# Patient Record
Sex: Male | Born: 1935 | Race: White | Hispanic: No | Marital: Married | Smoking: Former smoker
Health system: Southern US, Community
[De-identification: ages and names within clinical notes are randomized; demographics above are authoritative.]

## PROBLEM LIST (undated history)

## (undated) DIAGNOSIS — T148XXA Other injury of unspecified body region, initial encounter: Secondary | ICD-10-CM

## (undated) DIAGNOSIS — W19XXXA Unspecified fall, initial encounter: Secondary | ICD-10-CM

## (undated) DIAGNOSIS — I493 Ventricular premature depolarization: Secondary | ICD-10-CM

## (undated) DIAGNOSIS — I499 Cardiac arrhythmia, unspecified: Secondary | ICD-10-CM

## (undated) DIAGNOSIS — I2089 Other forms of angina pectoris: Secondary | ICD-10-CM

## (undated) DIAGNOSIS — F0390 Unspecified dementia without behavioral disturbance: Secondary | ICD-10-CM

## (undated) DIAGNOSIS — I208 Other forms of angina pectoris: Secondary | ICD-10-CM

## (undated) DIAGNOSIS — I10 Essential (primary) hypertension: Secondary | ICD-10-CM

## (undated) DIAGNOSIS — I519 Heart disease, unspecified: Secondary | ICD-10-CM

## (undated) DIAGNOSIS — E785 Hyperlipidemia, unspecified: Secondary | ICD-10-CM

## (undated) DIAGNOSIS — I259 Chronic ischemic heart disease, unspecified: Secondary | ICD-10-CM

## (undated) DIAGNOSIS — R6889 Other general symptoms and signs: Secondary | ICD-10-CM

## (undated) HISTORY — PX: HAND SURGERY: SHX662

## (undated) HISTORY — PX: TONSILLECTOMY: SUR1361

## (undated) HISTORY — DX: Other general symptoms and signs: R68.89

## (undated) HISTORY — DX: Cardiac arrhythmia, unspecified: I49.9

## (undated) HISTORY — DX: Unspecified dementia, unspecified severity, without behavioral disturbance, psychotic disturbance, mood disturbance, and anxiety: F03.90

## (undated) HISTORY — DX: Heart disease, unspecified: I51.9

## (undated) HISTORY — DX: Essential (primary) hypertension: I10

## (undated) HISTORY — DX: Ventricular premature depolarization: I49.3

## (undated) HISTORY — DX: Chronic ischemic heart disease, unspecified: I25.9

## (undated) HISTORY — DX: Hyperlipidemia, unspecified: E78.5

## (undated) HISTORY — DX: Other injury of unspecified body region, initial encounter: T14.8XXA

---

## 1998-08-15 ENCOUNTER — Other Ambulatory Visit: Admission: RE | Admit: 1998-08-15 | Discharge: 1998-08-15 | Payer: Self-pay | Admitting: Gastroenterology

## 1998-12-10 ENCOUNTER — Encounter: Payer: Self-pay | Admitting: Orthopedic Surgery

## 1998-12-10 ENCOUNTER — Emergency Department (HOSPITAL_COMMUNITY): Admission: EM | Admit: 1998-12-10 | Discharge: 1998-12-10 | Payer: Self-pay | Admitting: Emergency Medicine

## 1999-08-22 ENCOUNTER — Ambulatory Visit (HOSPITAL_COMMUNITY): Admission: RE | Admit: 1999-08-22 | Discharge: 1999-08-22 | Payer: Self-pay | Admitting: Ophthalmology

## 1999-11-22 ENCOUNTER — Other Ambulatory Visit: Admission: RE | Admit: 1999-11-22 | Discharge: 1999-11-22 | Payer: Self-pay | Admitting: Urology

## 2000-08-15 ENCOUNTER — Encounter: Admission: RE | Admit: 2000-08-15 | Discharge: 2000-08-15 | Payer: Self-pay | Admitting: *Deleted

## 2000-08-15 ENCOUNTER — Encounter: Payer: Self-pay | Admitting: *Deleted

## 2000-10-17 ENCOUNTER — Encounter: Payer: Self-pay | Admitting: Family Medicine

## 2000-10-17 ENCOUNTER — Ambulatory Visit (HOSPITAL_COMMUNITY): Admission: RE | Admit: 2000-10-17 | Discharge: 2000-10-17 | Payer: Self-pay | Admitting: Family Medicine

## 2000-10-27 ENCOUNTER — Ambulatory Visit (HOSPITAL_COMMUNITY): Admission: RE | Admit: 2000-10-27 | Discharge: 2000-10-27 | Payer: Self-pay | Admitting: Family Medicine

## 2000-10-27 ENCOUNTER — Encounter: Payer: Self-pay | Admitting: Family Medicine

## 2001-10-21 ENCOUNTER — Encounter: Payer: Self-pay | Admitting: Family Medicine

## 2001-10-21 ENCOUNTER — Ambulatory Visit (HOSPITAL_COMMUNITY): Admission: RE | Admit: 2001-10-21 | Discharge: 2001-10-21 | Payer: Self-pay | Admitting: Family Medicine

## 2002-03-21 HISTORY — PX: CARDIAC CATHETERIZATION: SHX172

## 2002-03-22 ENCOUNTER — Ambulatory Visit (HOSPITAL_COMMUNITY): Admission: RE | Admit: 2002-03-22 | Discharge: 2002-03-22 | Payer: Self-pay | Admitting: Cardiology

## 2002-11-04 ENCOUNTER — Encounter: Payer: Self-pay | Admitting: Family Medicine

## 2002-11-04 ENCOUNTER — Ambulatory Visit (HOSPITAL_COMMUNITY): Admission: RE | Admit: 2002-11-04 | Discharge: 2002-11-04 | Payer: Self-pay | Admitting: Family Medicine

## 2003-07-02 HISTORY — PX: US ECHOCARDIOGRAPHY: HXRAD669

## 2003-08-30 ENCOUNTER — Inpatient Hospital Stay (HOSPITAL_COMMUNITY): Admission: RE | Admit: 2003-08-30 | Discharge: 2003-09-04 | Payer: Self-pay | Admitting: Specialist

## 2004-12-24 ENCOUNTER — Ambulatory Visit (HOSPITAL_COMMUNITY): Admission: RE | Admit: 2004-12-24 | Discharge: 2004-12-24 | Payer: Self-pay | Admitting: Orthopedic Surgery

## 2008-03-22 ENCOUNTER — Inpatient Hospital Stay (HOSPITAL_COMMUNITY): Admission: RE | Admit: 2008-03-22 | Discharge: 2008-03-24 | Payer: Self-pay | Admitting: Orthopedic Surgery

## 2010-01-12 ENCOUNTER — Encounter: Admission: RE | Admit: 2010-01-12 | Discharge: 2010-01-12 | Payer: Self-pay | Admitting: Family Medicine

## 2010-02-12 ENCOUNTER — Ambulatory Visit: Payer: Self-pay | Admitting: Cardiology

## 2010-02-14 ENCOUNTER — Ambulatory Visit: Payer: Self-pay | Admitting: Cardiology

## 2010-06-13 ENCOUNTER — Ambulatory Visit: Payer: Self-pay | Admitting: Cardiology

## 2010-06-15 ENCOUNTER — Ambulatory Visit: Payer: Self-pay | Admitting: Cardiology

## 2010-10-29 ENCOUNTER — Other Ambulatory Visit: Payer: Self-pay | Admitting: *Deleted

## 2010-10-29 DIAGNOSIS — G479 Sleep disorder, unspecified: Secondary | ICD-10-CM

## 2010-10-29 MED ORDER — LORAZEPAM 0.5 MG PO TABS: 0.5000 mg | ORAL_TABLET | Freq: Every day | ORAL | Status: DC

## 2010-10-29 NOTE — Telephone Encounter (Signed)
Fax to [facility]

## 2010-11-13 NOTE — H&P (Signed)
NAME:  [name], [name]                 ACCOUNT NO.:  [id number]   MEDICAL RECORD NO.:  [id number]          PATIENT TYPE:  INP   LOCATION:  NA                           FACILITY:  [facility]   PHYSICIAN:  Madlyn Frankel. Charlann Boxer, M.D.  DATE OF BIRTH:  September 25, 1932   DATE OF ADMISSION:  DATE OF DISCHARGE:                              HISTORY & PHYSICAL   PROCEDURE:  Left total knee replacement.   CHIEF COMPLAINT:  Left knee pain.   HISTORY OF PRESENT ILLNESS:  An 77 year old male with a history of left  knee pain secondary to osteoarthritis.  It has been refractory to all  conservative treatment.  Does have significant history of a right total  knee replacement in the past with postoperative stiffness.  He has been  presurgically assessed prior to surgery by his cardiologist, Dr.  Patty Sermons.   GASTROENTEROLOGIST:  Dr. Matthias Hughs.   PAST MEDICAL HISTORY:  1. Significant for osteoarthritis.  2. Hypertension.  3. Dyslipidemia.  4. Reflux disease.  5. Impaired hearing.  6. Prostate disease.   PREVIOUS SURGERIES:  Branagan surgery, right knee replacement in 2005 with  postoperative stiffness.   FAMILY MEDICAL HISTORY:  Alzheimer's, brain tumor, heart disease.   SOCIAL HISTORY:  Married.  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   DRUG ALLERGIES:  DARVOCET.   MEDICATIONS:  1. Plavix 75 mg daily.  2. Lipitor 40 mg daily.  3. Niaspan tab daily.  4. Triamterine hydrochlorothiazide 37.5/25 p.o. daily.  5. Lisinopril 20 mg p.o. daily.  6. Toprol XL 50 mg daily.  7. Oxybutynin 5 mg daily.  8. Multivitamin 1 daily.  9. Glucosamine 1500 mg daily.  10.Aspirin 81 mg daily.  11.Celebrex 200 mg 1 p.o. b.i.d. x2 weeks after surgery.   REVIEW OF SYSTEMS:  HEENT, neuro, he has hearing loss with the use of  hearing aid.  Dentures.  Ringing in his ears.  GI:  He has occasional  heartburn, difficulty swallowing.  Otherwise, see HPI.   PHYSICAL EXAMINATION:  VITAL SIGNS:  Pulse 72, respirations 16, blood  pressure 98/56.  Height 5 feet 8 inches, weight 174 pounds.  GENERAL:  Awake, alert, and oriented.  Well-developed, well-nourished in  no acute distress.  NECK:  Supple.  No carotid bruits.  CHEST AND LUNGS:  Clear to auscultation bilaterally.  BREASTS:  Deferred.  HEART:  Regular rate and rhythm with S1, S2 distinct.  ABDOMEN:  Soft, nontender, nondistended.  Bowel sounds present.  GENITOURINARY:  Deferred.  EXTREMITIES:  Left knee 0-130 active range of motion.  SKIN:  No cellulitis.  NEUROLOGIC:  Intact distal sensibilities.   LABORATORY DATA:  Labs, EKG, chest x-ray all pending presurgical  testing.   IMPRESSION:  Left knee osteoarthritis.   PLAN:  Prednisone 12-day Dosepak postoperatively to minimize swelling  and decrease scarring to maximize postoperative range of motion  potential.  Risks and complications were discussed with the patient  including postoperative stiffness.   POSTOPERATIVE MEDICATIONS:  Include Lovenox, Robaxin, iron, aspirin,  Colace, Miralax, provided at the time of history and physical.  Celebrex  will be utilized preoperatively  as well.     ______________________________  Yetta Glassman. [address]ta [address], [place]      Madlyn Frankel. Charlann Boxer, M.D.  Electronically Signed    BLM/MEDQ  D:  03/14/2008  T:  03/15/2008  Job:  [id number]   cc:   Cassell Clement, M.D.  Fax: [phone]   Bernette Redbird, M.D.  Fax: [phone]

## 2010-11-13 NOTE — Op Note (Signed)
NAME:  [name], [name]                 ACCOUNT NO.:  [id number]   MEDICAL RECORD NO.:  [id number]          PATIENT TYPE:  INP   LOCATION:  0007                         FACILITY:  [facility]   PHYSICIAN:  Madlyn Frankel. Charlann Boxer, M.D.  DATE OF BIRTH:  Sep 11, 1932   DATE OF PROCEDURE:  03/22/2008  DATE OF DISCHARGE:                               OPERATIVE REPORT   PREOPERATIVE DIAGNOSIS:  Left knee osteoarthritis, history of right  total knee replacement, postoperative stiffness, arthrofibrosis.   POSTOPERATIVE DIAGNOSIS:  Left knee osteoarthritis, history of right  total knee replacement, postoperative stiffness, arthrofibrosis.   PROCEDURE:  Left total knee replacement.   COMPONENTS USED:  DePuy rotating platform posterior stabilized knee  system size 4 narrow femur, 3 tibia, 12.5 insert to match the 4 femur  and a 38 patellar button.   SURGEON:  Madlyn Frankel. Charlann Boxer, M.D.   ASSISTANT:  Yetta Glassman. Mann, PA   ANESTHESIA:  Duramorph spinal including use of some Decadron.   DRAINS:  Times one.   TOURNIQUET TIME:  40 minutes at 250 mmHg.   COMPLICATIONS:  None.   INDICATIONS FOR PROCEDURE:  [name] is a 77 year old gentleman who I  have been following for advancing left knee arthritis failing  conservative measures.  He had a lot of concerns based on proceeding  with left total knee replacement based on how the patient was left with  his right knee.  He had a right total knee replacement and had  arthrofibrosis and manipulation with failure to respond to any  significant range of motion.  His range of motion on his right side is  basically 5 to about 70-80 degrees.   We discussed these risks and benefits and ways to try to prevent it  which included the use of Decadron perioperative and prednisone postop  to try to decrease inflammatory response to scarring and healing; think  effect the standard risks of infection, DVT, component failure, need for  revision surgery.  Consent was obtained.   PROCEDURE IN DETAIL:  The patient was brought to the operative theater.  Once adequate anesthesia, preoperative antibiotics, Ancef administered,  the patient was positioned supine with a thigh tourniquet placed.  The  left lower extremity was pre scrubbed and prepped and draped in sterile  fashion.  Leg was exsanguinated, tourniquet elevated to 250 mmHg.  A  midline incision was made followed by a median arthrotomy.  Following  initial debridement attention was directed to the patella.  Precut  measurement was 21, 22 mm, I resected down to 13 mm and used a 38  patellar button.  Lug holes were drilled and a metal shim placed to  protect the patella.   The knee was then flexed and attention directed to the femur.  Femoral  canal was opened with a drill and irrigated to try to prevent fat  emboli.  Intramedullary rod was passed, with 3 degrees of valgus I  resected 10 mm bone off the distal femur.   Attention was now directed to the tibia.  The tibia was subluxated  anteriorly with removal of meniscus  and cruciate.  I used an  extramedullary guide and resected the proximal tibia in perpendicular  plane in both planes.   At this point I checked my extension gap to make sure that I was happy  with at least a 10 mm insert and with the 10 it seemed to hyperextend a  little bit and this was noted.   We went back to the femur.  At this point our attention was redirected  back to the femur where I measured it to be a size 4 in anterior and  posterior dimension.  I used the intramedullary rod at this point and  set the rotation of the femoral cutting block based on the cut surface  of the tibia which was perpendicular checking alignment rod.  Once this  block was pinned into position it was exchanged for the cutting block.  The anterior posterior and chamfer cuts were then made without notching  the femur.  Final box cut was made.  Based on the medial and lateral  dimensions of the knee I  decided I would use a 4 narrow component to  prevent any overhanging.   Following the box cut the tibia was subluxated anteriorly and attention  directed to the tibia.  Based on the position and orientation of the  tibia I decided to use a 5 degree lateralizing it and removing medial  osteophytes.  I turned it in position, drilled it and keel punched.   The trial reduction was now carried out with a 4 narrow, 3 tibial insert  and a size 3 tibial tray and a 12.5 insert.  The knee came to full  extension and flexed nicely with the patella tracking through the  trochlea without any application of pressure.   At this point the trial components were removed.  We injected the  synovial capsule junction layer with 60 mL of quarter percent Marcaine  with epinephrine and 1 mL of Toradol.  The knee was irrigated with  normal saline solution pulse lavage.  I debrided further soft tissues as  necessary.  Final components were opened as cement mixed.  Final  components were then cemented into position.  The knee was brought to  full extension with a 12.5 insert.  Extruded cement was removed.  Once  cement cured I removed remaining cement off the posterior aspect of the  knee, reirrigated prior to placing this final size 12.5 insert to match  the 4 narrow femur.  The knee was reduced with stable ligaments from  extension to flexion.   We reirrigated the knee at this point with pulse lavage and placed a  medium Hemovac drain deep, reapproximated extensor mechanism with #1  Vicryl with the knee in flexion.  The remainder of the wound was closed  with 2-0 Vicryl and running 4-0 Monocryl.  The knee was cleaned, dried  and dressed sterilely with Steri-Strips and bulky sterile wrap.  He was  brought to the recovery room in stable condition tolerating the  procedure well and was given CPM immediately.      Madlyn Frankel Charlann Boxer, M.D.  Electronically Signed     MDO/MEDQ  D:  03/22/2008  T:  03/23/2008   Job:  [id number]

## 2010-11-16 NOTE — H&P (Signed)
NAME:  [name], [name]                           ACCOUNT NO.:  [id number]   MEDICAL RECORD NO.:  [id number]                   PATIENT TYPE:  INP   LOCATION:  NA                                   FACILITY:  [facility]   PHYSICIAN:  Erasmo Leventhal, M.D.         DATE OF BIRTH:  [birth year]   DATE OF ADMISSION:  08/30/2003  DATE OF DISCHARGE:                                HISTORY & PHYSICAL   CHIEF COMPLAINT:  Right knee osteoarthritis.   HISTORY OF PRESENT ILLNESS:  This is a 77 year old gentleman well-known to  [place] with a history of bilateral osteoarthritis of the knees, right greater  than left.  The patient has had conservative therapy consisting of  arthroscopy, injections and therapy, without relief of his symptoms.  After  discussion of risks and benefits, the patient is now scheduled for total  knee arthroplasty.  Surgery risks, benefits, and aftercare were discussed in  detail with the patient.  Questions were invited and answered, and surgery  will go ahead as scheduled.  He has had medical clearance by his medical  doctor, Dr. Merri Brunette, and his cardiologist, Dr. Patty Sermons.   PAST MEDICAL HISTORY:   DRUG ALLERGIES:  DARVOCET.   CURRENT MEDICATIONS:  1. Toprol 50 mg one q.a.m.  2. Lipitor 40 mg one q.h.s.  3. Niaspan 1500 mg q.h.s.  4. Plavix 75 mg one q.d., but he stopped these prior to surgery.  5. Triamterene/hydrochlorothiazide 37.5/25 mg one half tablet q.d.  6. Monopril 40 mg q.a.m.  7. Aspirin, which he has stopped.   PREVIOUS SURGERIES:  Include Brew surgery, ear surgery, and bilateral knee  arthroscopies.   SERIOUS MEDICAL ILLNESSES:  Include coronary artery disease, hypertension,  history of TIA, osteoarthritis, and hypercholesterolemia.   SOCIAL HISTORY:  The patient is married.  He is retired.  He does not smoke  or drink.   FAMILY HISTORY:  Positive for Alzheimer's and cancer.   REVIEW OF SYSTEMS:  CNS:  Positive for history of TIAs.  No  headaches,  blurry vision or dizziness.  CARDIOVASCULAR:  Positive for hypertension,  coronary artery disease.  PULMONARY:  Positive for exertional shortness of  breath.  Negative for PND or orthopnea.  GI:  Negative for ulcers,  hepatitis.  GU:  Negative for urinary tract difficulty.  MUSCULOSKELETAL:  Positive in HPI.   PHYSICAL EXAM:  VITAL SIGNS:  BP 130/80, respirations 16, pulse 56 and  regular.  GENERAL APPEARANCE:  This is a well-developed, well-nourished gentleman in  no acute distress.  HEENT:  Head normocephalic.  Nose patent.  Ears patent.  Pupils equal, round  and reactive to light.  Throat without injection.  NECK:  Supple without adenopathy.  Carotids 2+ without bruit.  CHEST:  Clear to auscultation.  No rales or rhonchi.  Respirations 16.  HEART:  Regular rate and rhythm at 56 beats per minute without murmur.  ABDOMEN:  Soft without active  bowel sounds.  No masses or organomegaly.  NEUROLOGIC:  Patient alert and oriented to time, place and person.  Cranial  nerves II through XII grossly intact.  EXTREMITIES:  Within normal limits with the exception of the bilateral  knees, which show varus deformity bilaterally.  He has full extension with  further flexion to 135 degrees.  Dorsalis pedis and posterior tibial pulses  are 2+.   X-RAYS:  End-stage osteoarthritis with bone-on-bone arthritis of the right  knee.   IMPRESSION:  Osteoarthritis, end-stage, right knee.   PLAN:  Total knee arthroplasty, right knee.      Jaquelyn Bitter. Chabon, P.A.                   Erasmo Leventhal, M.D.    SJC/MEDQ  D:  08/26/2003  T:  08/26/2003  Job:  [id number]

## 2010-11-16 NOTE — Discharge Summary (Signed)
NAME:  [name], [name]                 ACCOUNT NO.:  [id number]   MEDICAL RECORD NO.:  [id number]          PATIENT TYPE:  INP   LOCATION:  1610                         FACILITY:  [facility]   PHYSICIAN:  Madlyn Frankel. Charlann Boxer, M.D.  DATE OF BIRTH:  1933/05/22   DATE OF ADMISSION:  03/22/2008  DATE OF DISCHARGE:  03/24/2008                               DISCHARGE SUMMARY   ADMITTING DIAGNOSES:  1. Osteoarthritis.  2. Hypertension.  3. Dyslipidemia.  4. Reflux disease.  5. Proteinuria.  6. Prostate disease.   DISCHARGE DIAGNOSES:  1. Osteoarthritis.  2. Hypertension.  3. Dyslipidemia.  4. Reflux disease.  5. Proteinuria.  6. Prostate disease.   HISTORY OF PRESENT ILLNESS:  A 77 year old male with a history of left  knee pain secondary to osteoarthritis.  Refractory to all conservative  treatment.  History of right total knee replacement in the past with  postoperative stiffness.   CONSULTS:  None.   PROCEDURE:  Left total knee replacement.   SURGEON:  Dr. Durene Romans, assistant Dwyane Luo, PA.   LABORATORY DATA:  His CBC upon admission showed his hematocrit to be  39.9, platelets 155.  At time of discharge hematocrit 27.8 and platelets  92.  White cell differential normal.  Coagulation all within normal  limits.  Routine chemistry upon admission all within normal limits and  tracked throughout.  At time of discharge sodium 140, potassium 3.9,  glucose 118, and creatinine 0.79.  Kidney function remain normal with  GFR greater than 60.  Last calcium was 8.6.  His UA was negative.   Cardiology:  EKG shows sinus bradycardia, otherwise normal ECG.   Radiology:  Chest 2-view showed no interval changes, no acute processes.   HOSPITAL COURSE:  The patient admitted to the hospital underwent a left  total knee replacement and admitted to orthopedic floor.  He had a  little bit of hypotension, otherwise no significant problems during his  course of stay.  He remained hemodynamically stable.   Hemovac was  discontinued on day 1 and dressing remained dry.  Wound without  significant drainage.  Made excellent progress with his physical  therapy.  We started him on prednisone, decreased postoperative  stiffness as well as scaring.  We held his blood pressure medicines.  He  ambulated 300 feet, modified independent.  Left knee wound was dry.  We  sent him home with a CPM machine.   DISCHARGE DISPOSITION:  Discharge home.  Stable, improved condition with  home health care physical therapy.   DISCHARGE PHYSICAL THERAPY:  Weightbearing as tolerated with the use of  a rolling walker.  CPM 0-80 degrees increased 10-20 degrees per day for  48 hours daily as tolerated.   DISCHARGE DIET:  Regular.   DISCHARGE WOUND CARE:  Keep dry.   DISCHARGE MEDICATIONS:  1. Lovenox 40 mg subcutaneously every 24 times 11 days.  2. Robaxin 500 mg p.o. every 6.  3. Iron 325 p.o. t.i.d. x2 weeks.  4. Aspirin 325 p.o. every day.  5. Colace 100 mg p.o. b.i.d.  6. MiraLax 17 g p.o. every  day.  7. Vicodin 7.5/325 one to two p.o. every 4-6 p.r.n. pain.  8. Prednisone 5 mg 12 day Dosepak take as directed.  9. Triamterene and hydrochlorothiazide 37.5/25 one p.o. every day.  10.Metoprolol ER 50 mg p.o. every day.  11.Oxybutynin 5 mg p.o. every day.  12.Niaspan ER 750 mg p.o. every day.  13.Lisinopril 40 mg p.o. daily.  Hold while systolic pressure less      than or equal to 120.  14.Lipitor 40 mg p.o. every day.  15.Plavix 75 mg p.o. every day.  16.Multivitamin every day.  17.Celebrex 200 mg p.o. b.i.d.   DISCHARGE FOLLOWUP:  Follow up with Dr. Charlann Boxer at phone number [phone] in  2 weeks.     ______________________________  Yetta Glassman [address], [place]      Madlyn Frankel. Charlann Boxer, M.D.  Electronically Signed   BLM/MEDQ  D:  04/05/2008  T:  04/05/2008  Job:  [id number]   cc:   Bernette Redbird, M.D.  Fax: [phone]

## 2010-11-16 NOTE — Cardiovascular Report (Signed)
   NAME:  [name], [name]                           ACCOUNT NO.:  [id number]   MEDICAL RECORD NO.:  [id number]                   PATIENT TYPE:  OIB   LOCATION:  2899                                 FACILITY:  [facility]   PHYSICIAN:  Peter M. [place], M.D.               DATE OF BIRTH:  October 12, 1932   DATE OF PROCEDURE:  03/21/2002  DATE OF DISCHARGE:                              CARDIAC CATHETERIZATION   INDICATIONS FOR PROCEDURE:  The patient is a 77 year old white male with a  history of hypertension, hypercholesterolemia, who has had a recent abnormal  stress Cardiolite study. He has known mild left ventricular dysfunction  by  previous Cardiolite study and by echocardiogram.   ACCESS:  Via the right femoral artery using the standard Seldinger  technique.   EQUIPMENT:  The 6 French 4 cm right and left Judkins catheter, 6 French  pigtail catheter, 6 French arterial sheath.   MEDICATIONS:  Local anesthesia with 1% Xylocaine, Versed 1 mg IV.   CONTRAST:  Omnipaque 100 cc.   HEMODYNAMIC DATA:  Aortic pressure was 109/61 with a mean of 82.  Left  ventricular  pressure 106 with an EDP of 10 mmHg.   ANGIOGRAPHIC DATA:  Left coronary artery arises and  distributes normally.   The left main coronary artery is normal.   The left anterior descending artery has 10% narrowing proximally.  No other  significant disease noted.   There is a large intermediate branch which has a 10-20% narrowing  proximally.   The left circumflex coronary artery is normal.   The right coronary artery is normal.   LEFT VENTRICULAR ANGIOGRAPHY:  The left ventricular angiography performed in  the RAO view demonstrates normal left ventricular size with mild global  hypokinesia.  Overall, ejection fraction is estimated at 50%.  There is no  mitral regurgitation or prolapse.    FINAL INTERPRETATION:  1. Minimal nonobstructive atherosclerotic coronary artery disease.  2. Mildly impaired left ventricular  function.                                                   Peter M. [place], M.D.    PMJ/MEDQ  D:  03/22/2002  T:  03/23/2002  Job:  [id number]   cc:   Thomas A. Patty Sermons, M.D.

## 2010-11-16 NOTE — Op Note (Signed)
NAME:  [name], [name]                           ACCOUNT NO.:  [id number]   MEDICAL RECORD NO.:  [id number]                   PATIENT TYPE:  INP   LOCATION:  2899                                 FACILITY:  [facility]   PHYSICIAN:  Erasmo Leventhal, M.D.         DATE OF BIRTH:  [birth year]   DATE OF PROCEDURE:  08/30/2003  DATE OF DISCHARGE:                                 OPERATIVE REPORT   PREOPERATIVE DIAGNOSIS:  Right knee end stage osteoarthritis.   POSTOPERATIVE DIAGNOSIS:  Right knee end stage osteoarthritis.   OPERATION PERFORMED:  Right total knee arthroplasty.   SURGEON:  Erasmo Leventhal, M.D.   ASSISTANT:  Jaquelyn Bitter. Chabon, P.A.   ANESTHESIA:  Attempted epidural followed by general with femoral nerve  block.   ESTIMATED BLOOD LOSS:  Less than 50 mL.   DRAINS:  Two medium Hemovacs.   COMPLICATIONS:  None.   TOURNIQUET TIME:  One hour 25 minutes at 350 mmHg.   IMPLANTS:  Osteonics components all cemented size 9 femur, size 9 tibia, 15  mm posterior stabilized Flex tibial insert and a 26 mm patella.   DISPOSITION:  PACU stable.   DESCRIPTION OF PROCEDURE:  The patient was counseled in the holding area.  IV was started.  Antibiotics given.  Epidural catheter was attempted by  anesthesia.  This was unsuccessful.  The patient was taken to the operating  room and placed under general anesthesia.  Foley catheter was placed  utilizing sterile technique by the operating room circulating nurse.  The  right knee was examined. He had a 7 degree flexion contracture, could flex  to 125 degrees.  The right leg was prepped with DuraPrep and draped in  sterile fashion.  Exsanguinated with Esmarch.  Tourniquet was inflated to  350 mmHg.  Straight and midline incision made through skin and subcutaneous  tissue.  Medial and lateral soft tissue flaps were developed at appropriate  level.  Medial parapatellar arthrotomy was performed.  Medial soft tissue  was released on  proximal medial tibia.  Patella was everted.  End stage  arthritic changes. Bone-against-bone contact.  Cruciate ligaments were  resected.  Starter hold made distal femur canal. Effluent was clear.  Intramedullary rod was gently placed.  Chose to take a 5 degree valgus cut  with a 12 mm cut off the distal femur.  Distal femur was cut.  At this time  the distal femur was found to be size #9, rotation marks were made and it  was cut to fit a size 9.  Medial and lateral menisci were removed.  Geniculate vessels were coagulated under direct visualization.  Posterior  neurovascular structures were thought about and protected throughout the  entire case.  Osteophytes were removed from the tibia.  Tibial eminence was  removed.  Proximal tibia was found to be a size 9.  Starter hole was made.  Step reamer was utilized.  Intramedullary rod  was gently placed after  irrigating the canal until the effluent was clear.  Chose a 0 degree slope  with a 10 mm cut based on the lateral side.  Proximal tibia was cut  appropriately.  Posteromedial, posterolateral femoral osteophytes were  removed meticulously under direct visualization.  The femoral cuts were  prepared.   With the size 9 femur, size 9 tibia, with a __________ insert  with  excellent range of motion, soft tissue balance in flexion and extension,  excellent alignment, rotation marks were made.  Delta keel was performed in  standard fashion.  Patella was found to be a size 26, reamed to the  appropriate depth.  Locking holes were made.  At this time the knee was  irrigated thoroughly with pulsatile lavage.  The cement was mixed.  Utilizing modern cement technique, all components were cemented in place,  size 9 tibia, size 9 femur, 26 patella.  After cement had cured, we did a  trial to 12 and 15 mm thickness with a 15 mm flexed tibial insert with an  excellent range of motion in flexion and extension.  Well-balanced in  flexion and extension.   ____________ tract was anatomic and did not require  lateral release.  The 50 mm trial was removed.  Bone wax was placed on  exposed bony surfaces.  Hemostasis was obtained posteromedial and  posterolateral.  A final 14 mm tibial insert was tapped into position,  flexed,  posteriorly stabilized.  Two medium Hemovac drains were placed,  knee joint and soft tissues irrigated with antibiotic solution and closed.  Arthrotomy closed with Vicryl sutures interrupted figure-of-eight fashion.  Subcu Vicryl.  Skin closed with subcuticular Monocryl suture.  Sterile  compressive dressing applied. Xeroform gauze over placement of a drain.  Tourniquet deflated.  Normal circulation to foot and ankle end of case.  Given another gram Ancef intravenously.  Sponge and needle counts were  correct.  Ice packs were applied.  Knee immobilized full extension.  Patient  awakened.  Femoral nerve block was administered for anesthesia.  Patient was  then awakened.  He was taken from operating room to recovery in stable  condition.  Sponge and needle counts were correct.  There were no  complications.  The patient tolerated the procedure well with no  complications.   To decrease surgical time, help throughout the entire surgical procedure by  Mr. Jeannett Senior Chabon's assistance was needed.                                               Erasmo Leventhal, M.D.    RAC/MEDQ  D:  08/30/2003  T:  08/31/2003  Job:  [id number]

## 2010-11-16 NOTE — Discharge Summary (Signed)
NAME:  Shaikh, DEVON PRETTY                           ACCOUNT NO.:  [id number]   MEDICAL RECORD NO.:  [id number]                   PATIENT TYPE:  INP   LOCATION:  5008                                 FACILITY:  [facility]   PHYSICIAN:  Erasmo Leventhal, M.D.         DATE OF BIRTH:  December 14, 1932   DATE OF ADMISSION:  08/30/2003  DATE OF DISCHARGE:  09/04/2003                                 DISCHARGE SUMMARY   ADMITTING DIAGNOSIS:  Right knee osteoarthritis.   DISCHARGE DIAGNOSIS:  Right knee osteoarthritis.   OPERATION:  Total knee arthroplasty, right knee.   BRIEF HISTORY:  This is a 77 year old gentleman with a history of  osteoarthritis to the bilateral knees, right greater than left.  He has  failed conservative treatment.  He is now scheduled for total knee  arthroplasty.  The surgery's benefits and risks were discussed with the  patient.  Surgery was scheduled.   LABORATORY VALUES:  Admission CBC within normal limits with the exception of  the platelets low at 149.  His hemoglobin hematocrit reached a low of  __________  on the 5th and then started back up.  Platelets reached a low of  86 but by discharge were up to 149.  Admission C-MET showed the CO2 slightly  high at 34, otherwise normal.  His glucoses ran elevated through the  admission with a high of 233 on the 1st, down to 130 by discharge.  He also  was a little hyponatremic on the 4th and 5th and a little hypokalemic as  well.  These were corrected.  Lipid profile showed cholesterol 178 with  triglycerides at 180 with HDL low at 38 and LDL high at 104.  Urinalysis was  within normal limits on the 23rd.  On the 3rd, showed moderate leukocyte  esterase.  Blood cultures were negative.   Portable chest x-ray showed mild basilar atelectasis.   HOSPITAL COURSE:  The patient tolerated the operative procedure well.  [facility]  hospitalists were contacted to manage the patient's medical course  postoperatively for his hypertension,  history of multiple TIAs,  hyperlipidemia.  The first postoperative day, the patient was doing fine.  Vital signs were stable.  Temp to 100.5.  Lunges were clear.  Calves were  negative.  Drain was removed without difficulty.  He was noted to have  elevated glucose and CBGs were ordered a.c. and h.s.  The second  postoperative day, vital signs remained stable with a temperature of 102.7,  O2 sat 91 on room air, CBG 143, platelets had dropped to 89,000.  PT INR  showed INR at 1.8.  Calves were negative.  Wound was benign.  Lungs were  clear.  Medical service was asked to followup fever of unknown origin and  decreased platelets.  Portable chest x-ray was ordered as were blood  cultures.  Third post day, the patient was feeling good.  Vital signs were  stable.  He  was afebrile that a.m.  Hemoglobin and hematocrit were stable.  Platelets were now at 90,000 which was coming up.  He was slightly  hyponatremic.  His urinalysis showed a mild UTI and medicine started the  treatment of that.  Chest x-ray also showed atelectasis and in-spirometry  increased.  Fourth postoperative day, he remained stable.  Temp to 100.5  max.  Vital signs were stable.  Wound was benign.  Neurovascular status was  normal.  There was no calf tenderness, no cords, negative Homan sign.  Fourth postoperative day, he continued to do well.  Vital signs were stable.  He was having mild nocturnal temps but afebrile in the a.m. and incision  CAD.  No ecchymosis.  Neurovascular status was intact and calves were  negative.  With his exam being stable and being cleared by medical service  for discharge home, the patient was subsequently discharged home for  followup in the office.   CONDITION ON DISCHARGE:  Improved.   DISCHARGE MEDICATIONS:  1. Coumadin per pharmacy protocol.  2. Percocet 1-2 q.6h. p.r.n. pain.  3. Robaxin 1 q.8h. p.r.n. spasm.  4. Keflex 250 one p.o. t.i.d.  5. Trinsicon 1 b.i.d.   DISCHARGE  INSTRUCTIONS:  He is to do his home physical therapy.  Call the  office for an appointment in ten days, call us sooner p.r.n. any problems.      Jaquelyn Bitter. Chabon, P.A.                   Erasmo Leventhal, M.D.    SJC/MEDQ  D:  10/14/2003  T:  10/15/2003  Job:  [id number]

## 2010-11-16 NOTE — Consult Note (Signed)
NAME:  [name], [name]                           ACCOUNT NO.:  [id number]   MEDICAL RECORD NO.:  [id number]                   PATIENT TYPE:  INP   LOCATION:  5008                                 FACILITY:  [facility]   PHYSICIAN:  Kela Millin, M.D.             DATE OF BIRTH:  Jul 22, 1932   DATE OF CONSULTATION:  08/30/2003  DATE OF DISCHARGE:                                   CONSULTATION   REASON FOR THIS CONSULT:  Postoperative management of hypertension and  medical management.   IMPRESSIONS/RECOMMENDATIONS:  1. Hypertension.  Will resume Toprol.  Continue monitoring blood pressure.     Resume lisinopril as blood pressure tolerates.  Will also obtain an EKG     and chemistries to screen for end organ damage.  2. History of multiple transient ischemic attacks.  The patient is currently     on Coumadin per surgery/primary team, holding off Plavix and aspirin     during this perioperative period.  3. Hyperlipidemia.  Will continue Lipitor.  Check a fasting lipid profile in     a.m.  The patient is also Niaspan as an outpatient.  Will resume when     clinically appropriate.  4. Status post total right knee arthroplasty per primary team for end-stage     osteoarthritis, right greater than left.   HISTORY OF PRESENT ILLNESS:  The patient is a 77 year old white male with  the above medical problems, admitted on August 30, 2003, for a right total  knee arthroplasty.  The patient's primary care physician is [name], M.D.  We were consulted for postoperative medical management,  including hypertension.  Today the patient is seen postoperatively and he  denies any complaints of chest pain, shortness of breath, headaches, and  pedal edema.  He also denies blurry vision, vocal weakness, and dysphagia.   PAST MEDICAL HISTORY:  1. As listed above.  2. Cardiac catheterization done in September of 2003 by Dr. Swaziland,     cardiologist, revealed an EF of 50% and was negative for  obstructive     atherosclerotic CAD.   MEDICATIONS IN THE HOSPITAL:  1. Colace 100 mg p.o. b.i.d.  2. Senokot one p.o. b.i.d., half at a.c.  3. __________ one p.o. t.i.d.  4. Robaxin 500 mg p.o./IV q.6h. p.r.n. spasm.  5. Benadryl 25-50 mg p.o. q.h.s. p.r.n.  6. Coumadin per protocol.   OUTPATIENT MEDICATIONS:  1. Toprol 60 mg one q.a.m.  2. Lipitor 40 mg one q.h.s.  3. Niaspan 1500 mg one q.h.s.  4. Plavix 75 mg one daily.  Stopped prior to surgery.  5. Maxzide 35/25 mg one daily.  6. Monopril 40 mg q.a.m.  7. Aspirin.  Stopped prior to surgery.   SOCIAL HISTORY:  Denies alcohol and tobacco.   FAMILY HISTORY:  Positive for brain tumors and Alzheimer's.   REVIEW OF SYSTEMS:  As per HPI.  PHYSICAL EXAMINATION:  GENERAL APPEARANCE:  The patient is a pleasant,  elderly, white male, alert and oriented and in no acute distress.  VITAL SIGNS:  The blood pressure is 139/77.  The pulse is 84.  The  respiratory rate is 16.  O2 saturation 93% on room air.  HEENT:  PERRL.  EOMI.  Sclerae anicteric.  NECK:  Supple.  No adenopathy.  No thyromegaly.  No carotid bruits.  No JVD.  LUNGS:  Clear to auscultation bilaterally.  CARDIOVASCULAR:  Normal S1 and S2.  Regular rate and rhythm.  No S3  appreciated.  ABDOMEN:  Soft.  Bowel sounds present.  Nontender and nondistended.  EXTREMITIES:  Right lower extremity with a brace and Ace in pedal area.  Left lower extremity compression stocking.  No edema appreciated.  NEUROLOGIC:  Alert and oriented x 3.  Limited as the patient is status post  surgery.   Thank you Dr. Thomasena Edis for allowing to participate in the care of this  patient.  Will follow along.                                               Kela Millin, M.D.    ACV/MEDQ  D:  08/30/2003  T:  08/31/2003  Job:  [id number]

## 2010-11-16 NOTE — H&P (Signed)
NAME:  [name], [name] NO.:  [id number]   MEDICAL RECORD NO.:  [id number]                  PATIENT TYPE:   LOCATION:                                       FACILITY:   PHYSICIAN:  [id number]                                DATE OF BIRTH:  [birth year]   DATE OF ADMISSION:  03/22/2002  DATE OF DISCHARGE:                                HISTORY & PHYSICAL   HISTORY OF PRESENT ILLNESS:  The patient is a 77 year old white male who has  a history of transient monocular blindness.  Carotid Doppler studies were  negative.  He also has a history of left ventricular dysfunction with  echocardiogram several years ago demonstrating an ejection fraction of 41%.  Most recent followup in 3/02, showed an ejection fraction of 45 to 50%.  He  also has a history of PVC's.  Because of his risk factors, the patient  underwent recent stress Cardiolite study.  This demonstrated no angina at 6  minutes of Bruce protocol without significant ST segment changes.  However,  Cardiolite study suggested possible anterior septal ischemia and ejection  fraction measured at 44%.  Based on these abnormalities, it was recommended  that the patient undergo cardiac catheterization.   PAST MEDICAL HISTORY:  1. History of transient monocular blindness.  2. Remote history of orthostatic hypotension.  3. History of essential hypertension.  4. History of hypercholesterolemia.   PAST SURGICAL HISTORY:  1. Tonsillectomy.  2. Right Kleier surgery.  3. Arthroscopic surgery of his right knee.  4. Surgery on his left shoulder.   ALLERGIES:  No known drug allergies.   CURRENT MEDICATIONS:  1. Coated aspirin 325 mg q.d.  2. Lipitor 40 mg q.d.  3. Plavix 75 mg q.d.  4. Monopril 40 mg q.d.  5. Niaspan 1500 mg q.h.s.  6. Toprol XL 50 mg q.d.  7. Celebrex 200 mg q.d.  8. Triamterene HCTZ q.d.  9. Vitamin B12 q.d.  10.      Prevacid 30 mg q.d.   SOCIAL HISTORY:  The patient is married and has three  sons.  He is now  retired.  He denies any tobacco or alcohol use.   FAMILY HISTORY:  Father died at age 48 with acute myocardial infarction.  Mother died at age 51 of Alzheimer's disease.  One brother died with cardiac  arrest.  Another brother died with brain cancer.  One brother was an  alcoholic and had an aneurysm in the brain.   REVIEW OF SYMPTOMS:  The patient has had no dizziness, syncope, orthopnea,  or PND.  He has had no edema, no recent change in bowel or bladder habits.  All other review of systems are negative.   PHYSICAL EXAMINATION:  GENERAL:  The patient is a pleasant white male in no  acute distress.  VITAL SIGNS:  Weight is 179, blood pressure is 124/70, pulse 76 and regular.  HEENT:  Pupils equal, round, reactive to light and accommodation.  Extraocular movements are full.  Oropharynx is clear.  NECK:  Supple without JVD, adenopathy, thyromegaly, or bruits.  LUNGS:  Clear to auscultation and percussion.  CARDIAC:  Regular rate and rhythm.  Normal S1 and S2 without gallops,  murmurs, rubs, or clicks.  ABDOMEN:  Soft and nontender without hepatosplenomegaly, masses, or bruits.  EXTREMITIES:  Without edema.  Pulses are 2+ and symmetric.  NEUROLOGIC:  Nonfocal.   LABORATORY DATA:  Chest x-ray shows no active disease.  Resting ECG shows  normal sinus rhythm with normal ECG.   IMPRESSION:  1. Abnormal stress Cardiolite study suggesting anterior septal ischemia.  2. Moderate left ventricular dysfunction.  3. Hypertension.  4. Hypercholesterolemia.  5. Family history of early coronary disease.  6. History of transient monocular blindness.   PLAN:  The patient is being admitted for cardiac catheterization with  further therapy pending these results.     Peter M. Swaziland, M.D.                     [phone]    PMJ/MEDQ  D:  03/15/2002  T:  03/15/2002  Job:  [id number]   cc:   Dario Guardian, M.D.   Thomas A. Patty Sermons, M.D.

## 2010-12-31 ENCOUNTER — Encounter: Payer: Self-pay | Admitting: Cardiology

## 2011-01-04 ENCOUNTER — Other Ambulatory Visit: Payer: Self-pay | Admitting: *Deleted

## 2011-01-04 DIAGNOSIS — E785 Hyperlipidemia, unspecified: Secondary | ICD-10-CM

## 2011-01-07 ENCOUNTER — Other Ambulatory Visit: Payer: Self-pay | Admitting: Cardiology

## 2011-01-07 ENCOUNTER — Other Ambulatory Visit (INDEPENDENT_AMBULATORY_CARE_PROVIDER_SITE_OTHER): Payer: Medicare Other | Admitting: *Deleted

## 2011-01-07 DIAGNOSIS — E785 Hyperlipidemia, unspecified: Secondary | ICD-10-CM

## 2011-01-07 LAB — BASIC METABOLIC PANEL
BUN: 15 mg/dL (ref 6–23)
Chloride: 105 mEq/L (ref 96–112)
GFR: 102.94 mL/min (ref >60.00)
Potassium: 3.8 mEq/L (ref 3.5–5.1)
Sodium: 141 mEq/L (ref 135–145)

## 2011-01-07 LAB — HEPATIC FUNCTION PANEL
ALT: 19 U/L (ref 0–53)
AST: 23 U/L (ref 0–37)
Alkaline Phosphatase: 49 U/L (ref 39–117)
Total Bilirubin: 0.6 mg/dL (ref 0.3–1.2)

## 2011-01-07 LAB — LIPID PANEL
Total CHOL/HDL Ratio: 6
VLDL: 56.6 mg/dL — ABNORMAL HIGH (ref 0.0–40.0)

## 2011-01-07 LAB — LDL CHOLESTEROL, DIRECT: Direct LDL: 197 mg/dL

## 2011-01-09 ENCOUNTER — Ambulatory Visit (INDEPENDENT_AMBULATORY_CARE_PROVIDER_SITE_OTHER): Payer: Medicare Other | Admitting: Cardiology

## 2011-01-09 ENCOUNTER — Encounter: Payer: Self-pay | Admitting: Cardiology

## 2011-01-09 VITALS — BP 130/72 | HR 77 | Wt 161.0 lb

## 2011-01-09 DIAGNOSIS — E782 Mixed hyperlipidemia: Secondary | ICD-10-CM

## 2011-01-09 DIAGNOSIS — I259 Chronic ischemic heart disease, unspecified: Secondary | ICD-10-CM | POA: Insufficient documentation

## 2011-01-09 DIAGNOSIS — I119 Hypertensive heart disease without heart failure: Secondary | ICD-10-CM

## 2011-01-09 DIAGNOSIS — F0390 Unspecified dementia without behavioral disturbance: Secondary | ICD-10-CM

## 2011-01-09 MED ORDER — EZETIMIBE 10 MG PO TABS: 10.0000 mg | ORAL_TABLET | Freq: Every day | ORAL | Status: DC

## 2011-01-09 NOTE — Assessment & Plan Note (Signed)
The patient has a history of known ischemic heart disease.  He has occasional chest pain consistent with angina.  It is relieved with sublingual nitroglycerin.

## 2011-01-09 NOTE — Progress Notes (Signed)
[name] Date of Birth:  1932/10/23 [facility] / [facility] [address]   [address] [place], [place]  [address] [phone]           Fax   [phone]  History of Present Illness: This pleasant 77 year old gentleman is seen for a scheduled six-month followup office visit.  The patient has a history of ischemic heart disease.  His last nuclear stress test was in 2008.  At that time there was no reversible ischemia seen.  The patient had a cardiac catheterization in 2003 which showed minimal nonobstructive coronary disease.  Patient has a history of significant hypercholesterolemia as well as elevated triglycerides he has a history of mild valvular heart disease with her cardiogram on 11/14/05Which time his ejection fraction was normal at 55-60% and his pulmonary artery pressure was normal.  He did have diastolic dysfunction with impaired relaxation.     Current Outpatient Prescriptions  Medication Sig Dispense Refill  . aspirin 81 MG tablet Take 81 mg by mouth daily.        Marland Kitchen atorvastatin (LIPITOR) 40 MG tablet Take 40 mg by mouth daily.        . clopidogrel (PLAVIX) 75 MG tablet Take 75 mg by mouth daily.        Marland Kitchen donepezil (ARICEPT) 5 MG tablet Take 5 mg by mouth at bedtime.        . fosinopril (MONOPRIL) 40 MG tablet Take 40 mg by mouth daily.        Marland Kitchen ibuprofen (ADVIL,MOTRIN) 200 MG tablet Take 200 mg by mouth every 6 (six) hours as needed.        . Memantine HCl (NAMENDA PO) Take by mouth. Taking twice a day       . metoprolol (TOPROL-XL) 50 MG 24 hr tablet Take 50 mg by mouth daily.        . Multiple Vitamin (MULTIVITAMIN) tablet Take 1 tablet by mouth daily.        . niacin (NIASPAN) 1000 MG CR tablet Take 1,000 mg by mouth at bedtime.        . triamterene-hydrochlorothiazide (DYAZIDE) 37.5-25 MG per capsule Take 1 capsule by mouth every morning.        . ezetimibe (ZETIA) 10 MG tablet Take 1 tablet (10 mg total) by mouth daily.  30 tablet  11  . LORazepam  (ATIVAN) 0.5 MG tablet Take 1 tablet (0.5 mg total) by mouth daily.  90 tablet  3    Allergies  Allergen Reactions  . Crestor (Rosuvastatin Calcium)   . Lipitor (Atorvastatin Calcium)     Patient Active Problem List  Diagnoses  . Hypercholesterolemia with hypertriglyceridemia  . Chronic ischemic heart disease  . Benign hypertensive heart disease without heart failure  . Dementia    History  Smoking status  . Former Smoker  . Quit date: 12/31/1990  Smokeless tobacco  . Not on file    History  Alcohol Use No    Family History  Problem Relation Age of Onset  . Alzheimer's disease Mother   . Heart attack Father     Review of Systems: Constitutional: no fever chills diaphoresis or fatigue or change in weight.  Head and neck: no hearing loss, no epistaxis, no photophobia or visual disturbance. Respiratory: No cough, shortness of breath or wheezing. Cardiovascular: No chest pain peripheral edema, palpitations. Gastrointestinal: No abdominal distention, no abdominal pain, no change in bowel habits hematochezia or melena. Genitourinary: No dysuria, no frequency, no urgency,  no nocturia. Musculoskeletal:No arthralgias, no back pain, no gait disturbance or myalgias. Neurological: No dizziness, no headaches, no numbness, no seizures, no syncope, no weakness, no tremors. Hematologic: No lymphadenopathy, no easy bruising. Psychiatric: No confusion, no hallucinations, no sleep disturbance.    Physical Exam: Filed Vitals:   01/09/11 0924  BP: 130/72  Pulse: 77  The general appearance reveals a well-developed well-nourished gentleman in no distress.The head and neck exam reveals pupils equal and reactive.  Extraocular movements are full.  There is no scleral icterus.  The mouth and pharynx are normal.  The neck is supple.  The carotids reveal no bruits.  The jugular venous pressure is normal.  The  thyroid is not enlarged.  There is no lymphadenopathy.  The chest is clear to  percussion and auscultation.  There are no rales or rhonchi.  Expansion of the chest is symmetrical.  The precordium is quiet.  The first heart sound is normal.  The second heart sound is physiologically split.  There is no murmur gallop rub or click.  There is no abnormal lift or heave.  The abdomen is soft and nontender.  The bowel sounds are normal.  The liver and spleen are not enlarged.  There are no abdominal masses.  There are no abdominal bruits.  Extremities reveal good pedal pulses.  There is no phlebitis or edema.  There is no cyanosis or clubbing.  Strength is normal and symmetrical in all extremities.  There is no lateralizing weakness.  There are no sensory deficits.  The skin is warm and dry.  There is no rash.   Assessment / Plan: The patient is to continue same medication except we are adding Zetia 10 mg one daily to help bring down his cholesterol.  We can go any higher on his Lipitor because he developed severe leg cramps at a higher dose.  He will continue same dose of Lipitor and Niaspan.  He will cut back on his consumption of cheese.  Recheck in 6 months for followup office visit and fasting lab work

## 2011-01-09 NOTE — Assessment & Plan Note (Signed)
The patient has a history of hypercholesterolemia and hypertriglyceridemia.  He is still eating a moderate amount of Cheddar cheese.He has stopped eating much ice cream.  His recent Lipids were significantly elevated both LDL cholesterol and triglyceride  Despite being on Niaspan and Lipitor.

## 2011-01-09 NOTE — Assessment & Plan Note (Signed)
The patient has a history of dementia which is slowly getting worse.  He is on Namenda and Aricept.

## 2011-01-30 ENCOUNTER — Other Ambulatory Visit: Payer: Self-pay | Admitting: Cardiology

## 2011-01-30 MED ORDER — NIACIN ER (ANTIHYPERLIPIDEMIC) 1000 MG PO TBCR: 1000.0000 mg | EXTENDED_RELEASE_TABLET | Freq: Every day | ORAL | Status: DC

## 2011-01-30 NOTE — Telephone Encounter (Signed)
Med refill niaspan

## 2011-01-31 ENCOUNTER — Other Ambulatory Visit: Payer: Self-pay | Admitting: *Deleted

## 2011-01-31 DIAGNOSIS — E7800 Pure hypercholesterolemia, unspecified: Secondary | ICD-10-CM

## 2011-01-31 MED ORDER — NIACIN ER (ANTIHYPERLIPIDEMIC) 1000 MG PO TBCR: 1000.0000 mg | EXTENDED_RELEASE_TABLET | Freq: Every day | ORAL | Status: DC

## 2011-01-31 NOTE — Telephone Encounter (Signed)
Refilled niaspan by fax

## 2011-03-01 ENCOUNTER — Telehealth: Payer: Self-pay | Admitting: Cardiology

## 2011-03-01 NOTE — Telephone Encounter (Signed)
Patient scratched arms up a few days ago on a rose bush.  Has had a lot of bleeding and she held Plavix this morning.  Discussed with Dr Swaziland and ok to hold plavix for a few days and then resume.

## 2011-03-01 NOTE — Telephone Encounter (Signed)
Pt's wife states pt has been bleeding from arms from taking Plavix.  Pt's wife would like medical advice. Please call pt back.  Pt's wife is aware since after 4pm may not receive call til next business day.

## 2011-03-01 NOTE — Telephone Encounter (Signed)
Chart in box 

## 2011-03-05 ENCOUNTER — Telehealth: Payer: Self-pay | Admitting: Cardiology

## 2011-03-05 NOTE — Telephone Encounter (Signed)
Pt's wife calling back about previous message.  Pt's wife would like a call back for medical advice regarding taking Plavix and what to do about him losing 2 pints of blood.  Please call pt wife back.

## 2011-03-07 NOTE — Telephone Encounter (Signed)
Patient had bleeding over weekend on arm where it was scratched in rose bushes.  Wife had held plavix.  Arm had quit bleeding and discussed with Lawson Fiscal on 9/4, ok to resume Plavix.  Called patients wife to check on him today.  Stated she started him back on plavix yesterday and he's doing fine.  Advised to call back if any unusual bruising or bleeding, may need to come in for labs.  Stated she would

## 2011-03-09 NOTE — Telephone Encounter (Signed)
Agree with advice given

## 2011-04-01 LAB — BASIC METABOLIC PANEL
BUN: 12
BUN: 14
Calcium: 8.6
Calcium: 9.4
Chloride: 106
Creatinine, Ser: 0.79
GFR calc Af Amer: >60
GFR calc non Af Amer: >60
GFR calc non Af Amer: >60
Glucose, Bld: 137 — ABNORMAL HIGH
Glucose, Bld: 98
Potassium: 3.8
Potassium: 4.1
Sodium: 136

## 2011-04-01 LAB — DIFFERENTIAL
Basophils Absolute: 0
Lymphocytes Relative: 22
Lymphs Abs: 1.5
Monocytes Absolute: 0.4
Neutro Abs: 4.6

## 2011-04-01 LAB — CBC
HCT: 30.7 — ABNORMAL LOW
HCT: 39.9
Hemoglobin: 13.8
MCV: 94.9
Platelets: 110 — ABNORMAL LOW
Platelets: 92 — ABNORMAL LOW
RBC: 4.23
RDW: 12.9
RDW: 13.3
WBC: 6.7
WBC: 7

## 2011-04-01 LAB — URINALYSIS, ROUTINE W REFLEX MICROSCOPIC
Ketones, ur: NEGATIVE
Nitrite: NEGATIVE
Protein, ur: NEGATIVE
pH: 6.5

## 2011-04-01 LAB — APTT: aPTT: 26

## 2011-04-01 LAB — TYPE AND SCREEN: ABO/RH(D): O POS

## 2011-07-22 ENCOUNTER — Other Ambulatory Visit: Payer: Self-pay | Admitting: *Deleted

## 2011-07-22 ENCOUNTER — Other Ambulatory Visit (INDEPENDENT_AMBULATORY_CARE_PROVIDER_SITE_OTHER): Payer: Medicare Other | Admitting: *Deleted

## 2011-07-22 DIAGNOSIS — E7800 Pure hypercholesterolemia, unspecified: Secondary | ICD-10-CM | POA: Diagnosis not present

## 2011-07-22 LAB — BASIC METABOLIC PANEL
Chloride: 99 mEq/L (ref 96–112)
Creatinine, Ser: 0.9 mg/dL (ref 0.4–1.5)
GFR: 88.28 mL/min (ref >60.00)
Potassium: 4.2 mEq/L (ref 3.5–5.1)

## 2011-07-22 LAB — HEPATIC FUNCTION PANEL
ALT: 21 U/L (ref 0–53)
Albumin: 4.2 g/dL (ref 3.5–5.2)
Total Protein: 7 g/dL (ref 6.0–8.3)

## 2011-07-22 LAB — LIPID PANEL
Cholesterol: 291 mg/dL — ABNORMAL HIGH (ref 0–200)
HDL: 46.4 mg/dL (ref >39.00)
VLDL: 40.4 mg/dL — ABNORMAL HIGH (ref 0.0–40.0)

## 2011-07-22 LAB — LDL CHOLESTEROL, DIRECT: Direct LDL: 196.4 mg/dL

## 2011-07-24 ENCOUNTER — Encounter: Payer: Self-pay | Admitting: Cardiology

## 2011-07-24 ENCOUNTER — Ambulatory Visit (INDEPENDENT_AMBULATORY_CARE_PROVIDER_SITE_OTHER): Payer: Medicare Other | Admitting: Cardiology

## 2011-07-24 VITALS — BP 118/80 | HR 68 | Ht 67.0 in | Wt 165.0 lb

## 2011-07-24 DIAGNOSIS — F0390 Unspecified dementia without behavioral disturbance: Secondary | ICD-10-CM

## 2011-07-24 DIAGNOSIS — E782 Mixed hyperlipidemia: Secondary | ICD-10-CM | POA: Diagnosis not present

## 2011-07-24 DIAGNOSIS — I119 Hypertensive heart disease without heart failure: Secondary | ICD-10-CM | POA: Diagnosis not present

## 2011-07-24 DIAGNOSIS — I259 Chronic ischemic heart disease, unspecified: Secondary | ICD-10-CM | POA: Diagnosis not present

## 2011-07-24 DIAGNOSIS — E7800 Pure hypercholesterolemia, unspecified: Secondary | ICD-10-CM

## 2011-07-24 NOTE — Patient Instructions (Signed)
Decrease your Aspirin to taking one on Monday, Wednesday, and Friday only Your physician wants you to follow-up in: 6  Months  You will receive a reminder letter in the mail two months in advance. If you don't receive a letter, please call our office to schedule the follow-up appointment.

## 2011-07-24 NOTE — Assessment & Plan Note (Signed)
His symptoms of dementia.  He stabilized on his current dose of generic Aricept 5 mg daily.  He is also on Namenda.

## 2011-07-24 NOTE — Assessment & Plan Note (Signed)
He denies any problems with headaches or dizzy spells.  Blood pressure has been remaining stable on current medication.

## 2011-07-24 NOTE — Progress Notes (Signed)
[name] Date of Birth:  12/11/1932 [facility] [address] [place], [place]  [address] [phone]         Fax   [phone]  History of Present Illness: This pleasant elderly gentleman is seen for a six-month followup office visit.  He has a history of ischemic heart disease.  He had a cardiac catheterization in 2003 which showed minimal nonobstructive coronary disease.  He had a nuclear stress test in 2008 which showed no reversible ischemia.  He does have significant hypercholesterolemia despite being on multiple medications.  He is trying to here to a low-cholesterol diet.  He has a history of normal systolic function with diastolic dysfunction by echo.  He does have some early dementia  Current Outpatient Prescriptions  Medication Sig Dispense Refill  . aspirin 81 MG tablet Take 81 mg by mouth as directed. 1 tablet Monday, Wednesday, and Friday only      . atorvastatin (LIPITOR) 40 MG tablet Take 40 mg by mouth daily.        . clopidogrel (PLAVIX) 75 MG tablet Take 75 mg by mouth daily.        Marland Kitchen donepezil (ARICEPT) 5 MG tablet Take 5 mg by mouth at bedtime.        Marland Kitchen ezetimibe (ZETIA) 10 MG tablet Take 1 tablet (10 mg total) by mouth daily.  30 tablet  11  . fosinopril (MONOPRIL) 40 MG tablet Take 40 mg by mouth daily.        Marland Kitchen ibuprofen (ADVIL,MOTRIN) 200 MG tablet Take 200 mg by mouth every 6 (six) hours as needed.        . Memantine HCl (NAMENDA PO) Take by mouth. Taking twice a day       . metoprolol (TOPROL-XL) 50 MG 24 hr tablet Take 50 mg by mouth daily.        . Multiple Vitamin (MULTIVITAMIN) tablet Take 1 tablet by mouth daily.        . niacin (NIASPAN) 1000 MG CR tablet Take 1 tablet (1,000 mg total) by mouth at bedtime.  90 tablet  4  . triamterene-hydrochlorothiazide (DYAZIDE) 37.5-25 MG per capsule Take 1 capsule by mouth every morning.        Marland Kitchen ALPRAZolam (XANAX) 0.5 MG tablet Take 0.5 mg by mouth as needed.      Marland Kitchen LORazepam (ATIVAN) 0.5  MG tablet Take 1 tablet (0.5 mg total) by mouth daily.  90 tablet  3    Allergies  Allergen Reactions  . Crestor (Rosuvastatin Calcium)   . Lipitor (Atorvastatin Calcium)     Patient Active Problem List  Diagnoses  . Hypercholesterolemia with hypertriglyceridemia  . Chronic ischemic heart disease  . Benign hypertensive heart disease without heart failure  . Dementia    History  Smoking status  . Former Smoker  . Quit date: 12/31/1990  Smokeless tobacco  . Not on file    History  Alcohol Use No    Family History  Problem Relation Age of Onset  . Alzheimer's disease Mother   . Heart attack Father     Review of Systems: Constitutional: no fever chills diaphoresis or fatigue or change in weight.  Head and neck: no hearing loss, no epistaxis, no photophobia or visual disturbance. Respiratory: No cough, shortness of breath or wheezing. Cardiovascular: No chest pain peripheral edema, palpitations. Gastrointestinal: No abdominal distention, no abdominal pain, no change in bowel habits hematochezia or melena. Genitourinary: No dysuria, no frequency, no urgency, no  nocturia. Musculoskeletal:No arthralgias, no back pain, no gait disturbance or myalgias. Neurological: No dizziness, no headaches, no numbness, no seizures, no syncope, no weakness, no tremors. Hematologic: No lymphadenopathy, no easy bruising. Psychiatric: No confusion, no hallucinations, no sleep disturbance.    Physical Exam: Filed Vitals:   07/24/11 1049  BP: 118/80  Pulse: 68   the general appearance reveals a well-developed elderly gentleman in no acute distress.The head and neck exam reveals pupils equal and reactive.  Extraocular movements are full.  There is no scleral icterus.  The mouth and pharynx are normal.  The neck is supple.  The carotids reveal no bruits.  The jugular venous pressure is normal.  The  thyroid is not enlarged.  There is no lymphadenopathy.  The chest is clear to percussion and  auscultation.  There are no rales or rhonchi.  Expansion of the chest is symmetrical.  The precordium is quiet.  The first heart sound is normal.  The second heart sound is physiologically split.  There is no murmur gallop rub or click.  There is no abnormal lift or heave.  The abdomen is soft and nontender.  The bowel sounds are normal.  The liver and spleen are not enlarged.  There are no abdominal masses.  There are no abdominal bruits.  Extremities reveal good pedal pulses.  There is no phlebitis or edema.  There is no cyanosis or clubbing.  Strength is normal and symmetrical in all extremities.  There is no lateralizing weakness.  There are no sensory deficits.  The skin is warm and dry.  There is no rash.     Assessment / Plan: Continue same medication except because of easy bruising we will decrease his aspirin to just 81 mg Monday Wednesday and Friday only.  He'll continue with daily Plavix.  He check in 6 months for followup office visit and fasting lab work

## 2011-07-24 NOTE — Assessment & Plan Note (Signed)
His cholesterol and LDL remain significantly elevated and he is going to work harder on weight loss and low cholesterol diet.

## 2011-08-12 DIAGNOSIS — G459 Transient cerebral ischemic attack, unspecified: Secondary | ICD-10-CM | POA: Diagnosis not present

## 2011-08-12 DIAGNOSIS — E782 Mixed hyperlipidemia: Secondary | ICD-10-CM | POA: Diagnosis not present

## 2011-08-12 DIAGNOSIS — I10 Essential (primary) hypertension: Secondary | ICD-10-CM | POA: Diagnosis not present

## 2011-08-12 DIAGNOSIS — R079 Chest pain, unspecified: Secondary | ICD-10-CM | POA: Diagnosis not present

## 2011-08-29 ENCOUNTER — Telehealth: Payer: Self-pay | Admitting: Cardiology

## 2011-08-29 DIAGNOSIS — R079 Chest pain, unspecified: Secondary | ICD-10-CM | POA: Diagnosis not present

## 2011-08-29 DIAGNOSIS — Z23 Encounter for immunization: Secondary | ICD-10-CM | POA: Diagnosis not present

## 2011-08-29 NOTE — Telephone Encounter (Signed)
Went to see PCP and had chest xray.  Everything checked out ok, was advised to see his cardiologist.   Dr. Patty Sermons out until 3/4, scheduled ov with Lawson Fiscal NP tomorrow for pain under breast

## 2011-08-29 NOTE — Telephone Encounter (Signed)
New msg Pt went to see Dr Katrinka Blazing today and he had xray. Pt's wife wanted to talk to you about this Please call her back

## 2011-08-30 ENCOUNTER — Ambulatory Visit (INDEPENDENT_AMBULATORY_CARE_PROVIDER_SITE_OTHER): Payer: Medicare Other | Admitting: Nurse Practitioner

## 2011-08-30 ENCOUNTER — Encounter: Payer: Self-pay | Admitting: Nurse Practitioner

## 2011-08-30 DIAGNOSIS — R079 Chest pain, unspecified: Secondary | ICD-10-CM | POA: Diagnosis not present

## 2011-08-30 LAB — BASIC METABOLIC PANEL
BUN: 16 mg/dL (ref 6–23)
CO2: 29 mEq/L (ref 19–32)
Calcium: 9.7 mg/dL (ref 8.4–10.5)
Chloride: 105 mEq/L (ref 96–112)
Creatinine, Ser: 1 mg/dL (ref 0.4–1.5)
GFR: 78.97 mL/min (ref >60.00)
Glucose, Bld: 90 mg/dL (ref 70–99)
Potassium: 4.4 mEq/L (ref 3.5–5.1)
Sodium: 139 mEq/L (ref 135–145)

## 2011-08-30 LAB — CBC WITH DIFFERENTIAL/PLATELET
Basophils Absolute: 0 10*3/uL (ref 0.0–0.1)
Basophils Relative: 0.8 % (ref 0.0–3.0)
Eosinophils Absolute: 0.2 10*3/uL (ref 0.0–0.7)
Eosinophils Relative: 3 % (ref 0.0–5.0)
HCT: 44.2 % (ref 39.0–52.0)
Hemoglobin: 15 g/dL (ref 13.0–17.0)
Lymphocytes Relative: 23.2 % (ref 12.0–46.0)
Lymphs Abs: 1.4 10*3/uL (ref 0.7–4.0)
MCHC: 33.9 g/dL (ref 30.0–36.0)
MCV: 96.8 fl (ref 78.0–100.0)
Monocytes Absolute: 0.5 10*3/uL (ref 0.1–1.0)
Monocytes Relative: 8.6 % (ref 3.0–12.0)
Neutro Abs: 3.9 10*3/uL (ref 1.4–7.7)
Neutrophils Relative %: 64.4 % (ref 43.0–77.0)
Platelets: 159 10*3/uL (ref 150.0–400.0)
RBC: 4.57 Mil/uL (ref 4.22–5.81)
RDW: 12.8 % (ref 11.5–14.6)
WBC: 6 10*3/uL (ref 4.5–10.5)

## 2011-08-30 LAB — CK TOTAL AND CKMB (NOT AT ARMC)
CK, MB: 1.8 ng/mL (ref 0.3–4.0)
Total CK: 76 U/L (ref 7–232)

## 2011-08-30 LAB — TROPONIN I: Troponin I: <0.3 ng/mL (ref <0.30)

## 2011-08-30 MED ORDER — OMEPRAZOLE MAGNESIUM 20 MG PO TBEC: 20.0000 mg | DELAYED_RELEASE_TABLET | Freq: Every day | ORAL | Status: DC

## 2011-08-30 MED ORDER — ISOSORBIDE MONONITRATE ER 30 MG PO TB24: 30.0000 mg | ORAL_TABLET | Freq: Every day | ORAL | Status: DC

## 2011-08-30 NOTE — Assessment & Plan Note (Signed)
Patient presents with a 3 week history of constant chest discomfort. His EKG is negative except for a resting bradycardia. He is on beta blocker therapy. He has known CAD but this presentation seems atypical to me. I have started low dose Imdur at 30 mg daily, OTC Prilosec daily and will check labs today to include cardiac enzymes. We will also update his stress test. Further disposition to follow. No other medicine changes today. Patient is agreeable to this plan and will call if any problems develop in the interim.

## 2011-08-30 NOTE — Patient Instructions (Signed)
We are going to check some labs today.  We are going to arrange for a stress test early next week.  I am going to put you on some long acting NTG - Imdur 30 mg take one a day  I also want you to get some OTC Prilosec at the drug store and take one a day.  Call the [facility] office at [phone] if you have any questions, problems or concerns.

## 2011-08-30 NOTE — Progress Notes (Signed)
[name] Date of Birth: 04-19-1933 Medical Record #[id number]  History of Present Illness: [name] is seen today for a work in visit. He is seen for Dr. Patty Sermons. He is here with his wife who provides a lot of the history. He is a 78 year old male with known CAD with minimal disease per cath in 2003. His last stress test was in 2008 and was satisfactory. He has known mild AS/AI per echo from 2005. His last visit here was in January and he was felt to be ok.   He comes in today due to complaints of chest pain. He says this started about 3 weeks ago. It is pretty constant. Does not seem to be any worse, but not getting any better either.  Not associated with any activity and not really worse with exertion. He had some night sweats earlier in the week. He saw his PCP. CXR was obtained and reportedly ok. He is not dizzy or lightheaded. No fever or chills. Has had a cough for about a month. He is on ACE. He says he is "cautious with his breathing". He has NTG on Demauro but has not used any. Sleeping ok. No N/V reported.   Current Outpatient Prescriptions on File Prior to Visit  Medication Sig Dispense Refill  . ALPRAZolam (XANAX) 0.5 MG tablet Take 0.5 mg by mouth as needed.      Marland Kitchen aspirin 81 MG tablet Take 81 mg by mouth as directed. 1 tablet Monday, Wednesday, and Friday only      . atorvastatin (LIPITOR) 40 MG tablet Take 40 mg by mouth daily.        . clopidogrel (PLAVIX) 75 MG tablet Take 75 mg by mouth daily.        Marland Kitchen donepezil (ARICEPT) 5 MG tablet Take 5 mg by mouth at bedtime.        Marland Kitchen ezetimibe (ZETIA) 10 MG tablet Take 1 tablet (10 mg total) by mouth daily.  30 tablet  11  . fosinopril (MONOPRIL) 40 MG tablet Take 40 mg by mouth daily.        Marland Kitchen ibuprofen (ADVIL,MOTRIN) 200 MG tablet Take 200 mg by mouth every 6 (six) hours as needed.        . Memantine HCl (NAMENDA PO) Take by mouth. Taking twice a day       . metoprolol (TOPROL-XL) 50 MG 24 hr tablet Take 50 mg by mouth daily.          . Multiple Vitamin (MULTIVITAMIN) tablet Take 1 tablet by mouth daily.        . niacin (NIASPAN) 1000 MG CR tablet Take 1 tablet (1,000 mg total) by mouth at bedtime.  90 tablet  4  . triamterene-hydrochlorothiazide (DYAZIDE) 37.5-25 MG per capsule Take 1 capsule by mouth every morning.        Marland Kitchen LORazepam (ATIVAN) 0.5 MG tablet Take 1 tablet (0.5 mg total) by mouth daily.  90 tablet  3    Allergies  Allergen Reactions  . Codeine   . Crestor (Rosuvastatin Calcium)   . Lipitor (Atorvastatin Calcium)     Past Medical History  Diagnosis Date  . Hypertension   . Hyperlipidemia   . LV dysfunction   . Chest pain   . IHD (ischemic heart disease)   . Coronary artery disease   . Constipation   . Bruising   . Forgetfulness   . Dementia   . PVC's (premature ventricular contractions)   . Ventricular arrhythmia  Past Surgical History  Procedure Date  . Cardiac catheterization 03/21/2002    MILD GLOBAL HYPOKINESIA. EF 50%  . US echocardiography     MILD GLOBAL HYPOKINESIS WITH EF 41%  . US echocardiography 09/09/00    SHOWED THAT THE EF HAD INCREASED FROM 45-50%  . Tonsillectomy   . Varney surgery     RIGHT [place]    History  Smoking status  . Former Smoker  . Quit date: 12/31/1990  Smokeless tobacco  . Not on file    History  Alcohol Use No    Family History  Problem Relation Age of Onset  . Alzheimer's disease Mother   . Heart attack Father     Review of Systems: The review of systems is positive for chest pain.  His aspirin was cut back in January due to easy bruising. All other systems were reviewed and are negative.  Physical Exam: BP 118/70  Pulse 48  Ht 5\' 8"  (1.727 m)  Wt 163 lb (73.936 kg)  BMI 24.78 kg/m2 Patient is very pleasant and in no acute distress. Skin is warm and dry. Color is normal.  HEENT is unremarkable. Normocephalic/atraumatic. PERRL. Sclera are nonicteric. Neck is supple. No masses. No JVD. Lungs are clear. Cardiac exam shows a regular  rate and rhythm. Rate is in the 50's by me. No palpable chest wall pain.  Abdomen is soft. Extremities are without edema. Gait and ROM are intact. No gross neurologic deficits noted.  LABORATORY DATA: EKG shows sinus bradycardia.    Assessment / Plan:

## 2011-09-02 ENCOUNTER — Ambulatory Visit (HOSPITAL_COMMUNITY): Payer: Medicare Other | Attending: Internal Medicine | Admitting: Radiology

## 2011-09-02 DIAGNOSIS — R079 Chest pain, unspecified: Secondary | ICD-10-CM

## 2011-09-02 DIAGNOSIS — I10 Essential (primary) hypertension: Secondary | ICD-10-CM | POA: Insufficient documentation

## 2011-09-02 DIAGNOSIS — Z87891 Personal history of nicotine dependence: Secondary | ICD-10-CM | POA: Insufficient documentation

## 2011-09-02 DIAGNOSIS — Z8249 Family history of ischemic heart disease and other diseases of the circulatory system: Secondary | ICD-10-CM | POA: Diagnosis not present

## 2011-09-02 DIAGNOSIS — E785 Hyperlipidemia, unspecified: Secondary | ICD-10-CM | POA: Insufficient documentation

## 2011-09-02 MED ORDER — TECHNETIUM TC 99M TETROFOSMIN IV KIT
11.0000 | PACK | Freq: Once | INTRAVENOUS | Status: AC | PRN
  Administered 2011-09-02: 11 via INTRAVENOUS

## 2011-09-02 MED ORDER — REGADENOSON 0.4 MG/5ML IV SOLN
0.4000 mg | Freq: Once | INTRAVENOUS | Status: AC
  Administered 2011-09-02: 0.4 mg via INTRAVENOUS

## 2011-09-02 MED ORDER — TECHNETIUM TC 99M TETROFOSMIN IV KIT
33.0000 | PACK | Freq: Once | INTRAVENOUS | Status: AC | PRN
  Administered 2011-09-02: 33 via INTRAVENOUS

## 2011-09-02 NOTE — Progress Notes (Signed)
[facility] SITE 3 NUCLEAR MED [address] [place] [address] [phone]  Cardiology Nuclear Med [name] is a 78 y.o. male [id number] [birth year]   Nuclear Med Background Indication for Stress Test:  Evaluation for Ischemia History: 03/21/02 Heart Catheterization: EF;50% N/O CAD, 11/05 ECHO: EF: 55-60% mild AS/AI, 09/11/06  MPS: EF: 51% NL Cardiac Risk Factors: Family History - CAD, History of Smoking, Hypertension and Lipids  Symptoms:  Chest Pain   Nuclear Pre-Procedure Caffeine/Decaff Intake:  None NPO After: 7:00pm   Lungs:  clear IV 0.9% NS with Angio Cath:  20g  IV Site: R [place]  IV Started by:  Cathlyn Parsons, RN  Chest Size (in):  38 Cup Size: n/a  Height: 5\' 8"  (1.727 m)  Weight:  157 lb (71.215 kg)  BMI:  Body mass index is 23.87 kg/(m^2). Tech Comments:  Toprol held x 12 hrs    Nuclear Med Study 1 or 2 day study: 1 day  Stress Test Type:  Lexiscan  Reading MD: Dietrich Pates, MD  Order Authorizing Provider:  Villa Herb and Sunday Spillers  Resting Radionuclide: Technetium 62m Tetrofosmin  Resting Radionuclide Dose: 11.0 mCi   Stress Radionuclide:  Technetium 87m Tetrofosmin  Stress Radionuclide Dose: 33.0 mCi           Stress Protocol Rest HR: 53 Stress HR: 111  Rest BP: 127/76 Stress BP: 136/80  Exercise Time (min): n/a METS: n/a   Predicted Max HR: 144 bpm % Max HR: 76.39 bpm Rate Pressure Product: 13244   Dose of Adenosine (mg):  n/a Dose of Lexiscan: 0.4 mg  Dose of Atropine (mg): n/a Dose of Dobutamine: n/a mcg/kg/min (at max HR)  Stress Test Technologist: Milana Na, EMT-P  Nuclear Technologist:  Domenic Polite, CNMT     Rest Procedure:  Myocardial perfusion imaging was performed at rest 45 minutes following the intravenous administration of Technetium 15m Tetrofosmin. Rest ECG: Sinus Bradycardia  Stress Procedure:  The patient received IV Lexiscan 0.4 mg over 15-seconds.  Technetium 24m  Tetrofosmin injected at 30-seconds.  There were no significant changes with Lexiscan.  Quantitative spect images were obtained after a 45 minute delay. Stress ECG: No significant change from baseline ECG  QPS Raw Data Images:  Soft tissue (diaphragm, subcutaneous fat) surround heart.  Images were motion corrected. Stress Images:  Thinning in the inferior wall (base, mid).  Otherwise normal perfusion. Rest Images:  No change from the stress images.  Subtraction (SDS):  No ischemia Transient Ischemic Dilatation (Normal <1.22):  0.99 Lung/Heart Ratio (Normal <0.45):  0.29  Quantitative Gated Spect Images QGS EDV:  129 ml QGS ESV:  75 ml QGS cine images:  LVEF appears greater by visual estimate QGS EF: 42%  Impression Exercise Capacity:  Lexiscan with low level exercise. BP Response:  Normal blood pressure response. Clinical Symptoms:  Mild chest pain/dyspnea. ECG Impression:  No significant ST segment change suggestive of ischemia. Comparison with Prior Nuclear Study:  No ischemia noted as well.  LVEF was better.  Overall Impression:  Electrically negative for ischemia.  Myoview with inferior/inferoseptal thinning consistent with possible soft tissue attenuation and or scar.  No evidence if ischemia.  LVEF calculated at 42%.  Appears greater visually.  Consider echo to evaluate fully.  Overall low risk scan.

## 2011-09-04 ENCOUNTER — Telehealth: Payer: Self-pay | Admitting: *Deleted

## 2011-09-04 ENCOUNTER — Telehealth: Payer: Self-pay | Admitting: Cardiology

## 2011-09-04 NOTE — Telephone Encounter (Signed)
New Msg: Pt wife calling wanting to know results of pt stress test. Please return pt call to discuss further.

## 2011-09-04 NOTE — Telephone Encounter (Signed)
Message copied by Burnell Blanks on Wed Sep 04, 2011 12:41 PM ------      Message from: Rosalio Macadamia      Created: Fri Aug 30, 2011 12:59 PM       Ok to report. Labs are satisfactory. Will see what the stress test shows.

## 2011-09-04 NOTE — Telephone Encounter (Signed)
Message copied by Burnell Blanks on Wed Sep 04, 2011 12:38 PM ------      Message from: Cassell Clement      Created: Tue Sep 03, 2011  7:52 PM       Please report.  The stress test shows no ischemia.  The EF is lower 42%. The Myoview may be underestimating the EF.  I would like him to get an echo to evaluate LV systolic and diastolic function.

## 2011-09-04 NOTE — Telephone Encounter (Signed)
Advised wife of results.  

## 2011-09-04 NOTE — Telephone Encounter (Signed)
Advised wife and will get echo scheduled

## 2011-09-12 ENCOUNTER — Other Ambulatory Visit: Payer: Self-pay

## 2011-09-12 ENCOUNTER — Ambulatory Visit (HOSPITAL_COMMUNITY): Payer: Medicare Other | Attending: Cardiology

## 2011-09-12 DIAGNOSIS — I080 Rheumatic disorders of both mitral and aortic valves: Secondary | ICD-10-CM | POA: Diagnosis not present

## 2011-09-12 DIAGNOSIS — R072 Precordial pain: Secondary | ICD-10-CM

## 2011-09-12 DIAGNOSIS — I10 Essential (primary) hypertension: Secondary | ICD-10-CM | POA: Insufficient documentation

## 2011-09-12 DIAGNOSIS — I379 Nonrheumatic pulmonary valve disorder, unspecified: Secondary | ICD-10-CM | POA: Diagnosis not present

## 2011-09-12 DIAGNOSIS — E785 Hyperlipidemia, unspecified: Secondary | ICD-10-CM | POA: Insufficient documentation

## 2011-09-12 DIAGNOSIS — I079 Rheumatic tricuspid valve disease, unspecified: Secondary | ICD-10-CM | POA: Diagnosis not present

## 2011-09-13 ENCOUNTER — Telehealth: Payer: Self-pay | Admitting: Cardiology

## 2011-09-13 NOTE — Telephone Encounter (Signed)
Advised pt that Dr. Patty Sermons would review ECHO and nurse will call with results early next week.  Pt agrees.

## 2011-09-13 NOTE — Telephone Encounter (Signed)
Pt would like echo results 

## 2011-09-14 NOTE — Telephone Encounter (Signed)
Please report.  The echo shows EF 45-50% which is better than the nuclear (42%).  Continue same Rx.

## 2011-09-16 ENCOUNTER — Telehealth: Payer: Self-pay | Admitting: Cardiology

## 2011-09-16 MED ORDER — NITROGLYCERIN 0.4 MG SL SUBL: 0.4000 mg | SUBLINGUAL_TABLET | SUBLINGUAL | Status: DC | PRN

## 2011-09-16 NOTE — Telephone Encounter (Signed)
Advised wife  

## 2011-09-16 NOTE — Telephone Encounter (Signed)
I would like him to try some SL NTG to see if it helps the chest pain at all.  He could also try some Aleve to see if that would help.  Consider seeing him for a work-in OV and EKG next week to discuss response. If diagnosis still in doubt he may need another cath despite the Myoview findings.

## 2011-09-16 NOTE — Telephone Encounter (Signed)
Advised patients wife of echo results.  Wife states he has constant headaches and chest pains and doesn't know what to do from here.  Chest pains is there all the time but worse at times and does radiate down left arm.  Will forward to  Dr. Patty Sermons for review

## 2011-09-16 NOTE — Telephone Encounter (Signed)
Results given to wife 

## 2011-09-16 NOTE — Telephone Encounter (Signed)
Pt's wife calling to get echo results?

## 2012-02-14 DIAGNOSIS — H43819 Vitreous degeneration, unspecified eye: Secondary | ICD-10-CM | POA: Diagnosis not present

## 2012-03-04 DIAGNOSIS — Z23 Encounter for immunization: Secondary | ICD-10-CM | POA: Diagnosis not present

## 2012-04-13 ENCOUNTER — Ambulatory Visit (INDEPENDENT_AMBULATORY_CARE_PROVIDER_SITE_OTHER): Payer: Medicare Other | Admitting: Cardiology

## 2012-04-13 ENCOUNTER — Encounter: Payer: Self-pay | Admitting: Cardiology

## 2012-04-13 ENCOUNTER — Other Ambulatory Visit (INDEPENDENT_AMBULATORY_CARE_PROVIDER_SITE_OTHER): Payer: Medicare Other

## 2012-04-13 VITALS — BP 122/70 | HR 67 | Ht 68.0 in | Wt 169.0 lb

## 2012-04-13 DIAGNOSIS — E7800 Pure hypercholesterolemia, unspecified: Secondary | ICD-10-CM

## 2012-04-13 DIAGNOSIS — I25810 Atherosclerosis of coronary artery bypass graft(s) without angina pectoris: Secondary | ICD-10-CM | POA: Diagnosis not present

## 2012-04-13 DIAGNOSIS — F0390 Unspecified dementia without behavioral disturbance: Secondary | ICD-10-CM

## 2012-04-13 DIAGNOSIS — I259 Chronic ischemic heart disease, unspecified: Secondary | ICD-10-CM | POA: Diagnosis not present

## 2012-04-13 DIAGNOSIS — I119 Hypertensive heart disease without heart failure: Secondary | ICD-10-CM | POA: Diagnosis not present

## 2012-04-13 LAB — LIPID PANEL
Cholesterol: 288 mg/dL — ABNORMAL HIGH (ref 0–200)
Total CHOL/HDL Ratio: 7
VLDL: 37.4 mg/dL (ref 0.0–40.0)

## 2012-04-13 LAB — HEPATIC FUNCTION PANEL
ALT: 18 U/L (ref 0–53)
AST: 26 U/L (ref 0–37)
Alkaline Phosphatase: 49 U/L (ref 39–117)
Bilirubin, Direct: 0.1 mg/dL (ref 0.0–0.3)
Total Bilirubin: 0.5 mg/dL (ref 0.3–1.2)

## 2012-04-13 LAB — BASIC METABOLIC PANEL
BUN: 17 mg/dL (ref 6–23)
Chloride: 100 mEq/L (ref 96–112)
GFR: 85.88 mL/min (ref >60.00)
Potassium: 4.2 mEq/L (ref 3.5–5.1)
Sodium: 138 mEq/L (ref 135–145)

## 2012-04-13 LAB — LDL CHOLESTEROL, DIRECT: Direct LDL: 214.6 mg/dL

## 2012-04-13 NOTE — Patient Instructions (Signed)
Will obtain labs today and call you with the results (lp/bmet/hfp)  Your physician recommends that you continue on your current medications as directed. Please refer to the Current Medication list given to you today.  Your physician wants you to follow-up in: 6 months with fasting labs (lp/bmet/hfp)  You will receive a reminder letter in the mail two months in advance. If you don't receive a letter, please call our office to schedule the follow-up appointment.  

## 2012-04-13 NOTE — Progress Notes (Signed)
Quick Note:  Please report to patient. The recent labs are stable. Continue same medication and careful diet. Cholesterol still very high despite Rx. Is he remembering to take his pills every day? ______

## 2012-04-13 NOTE — Progress Notes (Signed)
[name] Date of Birth:  [birth year] [facility] [address] [place], [place]  [address] [phone]         Fax   [phone]  History of Present Illness: This pleasant elderly gentleman is seen for a six-month followup office visit. He has a history of ischemic heart disease. He had a cardiac catheterization in 2003 which showed minimal nonobstructive coronary disease. He had a nuclear stress test in 2008 which showed no reversible ischemia. He does have significant hypercholesterolemia despite being on multiple medications. He is trying to here to a low-cholesterol diet. He has a history of normal systolic function with diastolic dysfunction by echo. He does have some early dementia Since last visit he has had no new cardiac symptoms  Current Outpatient Prescriptions  Medication Sig Dispense Refill  . ALPRAZolam (XANAX) 0.5 MG tablet Take 0.5 mg by mouth as needed.      Marland Kitchen aspirin 81 MG tablet Take 81 mg by mouth as directed. 1 tablet Monday, Wednesday, and Friday only      . atorvastatin (LIPITOR) 40 MG tablet Take 40 mg by mouth daily.        . clopidogrel (PLAVIX) 75 MG tablet Take 75 mg by mouth daily.        Marland Kitchen donepezil (ARICEPT) 5 MG tablet Take 5 mg by mouth at bedtime.        . fosinopril (MONOPRIL) 40 MG tablet Take 40 mg by mouth daily.        Marland Kitchen LORazepam (ATIVAN) 0.5 MG tablet Take 1 tablet (0.5 mg total) by mouth daily.  90 tablet  3  . Memantine HCl (NAMENDA PO) Take by mouth. Taking twice a day       . metoprolol (TOPROL-XL) 50 MG 24 hr tablet Take 50 mg by mouth daily.        . Multiple Vitamin (MULTIVITAMIN) tablet Take 1 tablet by mouth daily.        . niacin (NIASPAN) 1000 MG CR tablet Take 1 tablet (1,000 mg total) by mouth at bedtime.  90 tablet  4  . nitroGLYCERIN (NITROSTAT) 0.4 MG SL tablet Place 1 tablet (0.4 mg total) under the tongue every 5 (five) minutes as needed for chest pain.  25 tablet  prn  . triamterene-hydrochlorothiazide  (DYAZIDE) 37.5-25 MG per capsule Take 1 capsule by mouth every morning.        . ezetimibe (ZETIA) 10 MG tablet Take 1 tablet (10 mg total) by mouth daily.  30 tablet  11    Allergies  Allergen Reactions  . Codeine   . Crestor (Rosuvastatin Calcium)   . Lipitor (Atorvastatin Calcium)     Patient Active Problem List  Diagnosis  . Hypercholesterolemia with hypertriglyceridemia  . Chronic ischemic heart disease  . Benign hypertensive heart disease without heart failure  . Dementia  . Chest pain    History  Smoking status  . Former Smoker  . Quit date: 12/31/1990  Smokeless tobacco  . Not on file    History  Alcohol Use No    Family History  Problem Relation Age of Onset  . Alzheimer's disease Mother   . Heart attack Father     Review of Systems: Constitutional: no fever chills diaphoresis or fatigue or change in weight.  Head and neck: no hearing loss, no epistaxis, no photophobia or visual disturbance. Respiratory: No cough, shortness of breath or wheezing. Cardiovascular: No chest pain peripheral edema, palpitations. Gastrointestinal: No abdominal distention, no  abdominal pain, no change in bowel habits hematochezia or melena. Genitourinary: No dysuria, no frequency, no urgency, no nocturia. Musculoskeletal:No arthralgias, no back pain, no gait disturbance or myalgias. Neurological: No dizziness, no headaches, no numbness, no seizures, no syncope, no weakness, no tremors. Hematologic: No lymphadenopathy, no easy bruising. Psychiatric: No confusion, no hallucinations, no sleep disturbance.    Physical Exam: Filed Vitals:   04/13/12 1007  BP: 122/70  Pulse: 67   the general appearance reveals a well-developed well-nourished gentleman in no distress.The head and neck exam reveals pupils equal and reactive.  Extraocular movements are full.  There is no scleral icterus.  The mouth and pharynx are normal.  The neck is supple.  The carotids reveal no bruits.  The  jugular venous pressure is normal.  The  thyroid is not enlarged.  There is no lymphadenopathy.  The chest is clear to percussion and auscultation.  There are no rales or rhonchi.  Expansion of the chest is symmetrical.  The precordium is quiet.  The first heart sound is normal.  The second heart sound is physiologically split.  There is no murmur gallop rub or click.  There is no abnormal lift or heave.  The abdomen is soft and nontender.  The bowel sounds are normal.  The liver and spleen are not enlarged.  There are no abdominal masses.  There are no abdominal bruits.  Extremities reveal good pedal pulses.  There is no phlebitis or edema.  There is no cyanosis or clubbing.  Strength is normal and symmetrical in all extremities.  There is no lateralizing weakness.  There are no sensory deficits.  The skin is warm and dry.  There is no rash.     Assessment / Plan: Continue same medication.  Blood work today is pending.  Recheck in 6 months for followup office visit and fasting lab work

## 2012-04-13 NOTE — Assessment & Plan Note (Signed)
His short-term memory appears to be getting gradually worse.  His wife covers well for him.  The patient remains on Namenda and Aricept

## 2012-04-13 NOTE — Assessment & Plan Note (Signed)
Patient has had no recurrent angina pectoris.  Is not having any symptoms of CHF.

## 2012-04-13 NOTE — Assessment & Plan Note (Signed)
Blood pressures remained stable on current therapy.  He has not been having any dizziness or syncope.

## 2012-04-14 ENCOUNTER — Telehealth: Payer: Self-pay | Admitting: *Deleted

## 2012-04-14 NOTE — Telephone Encounter (Signed)
Message copied by Burnell Blanks on Tue Apr 14, 2012  9:35 AM ------      Message from: Cassell Clement      Created: Mon Apr 13, 2012  8:45 PM       Please report to patient.  The recent labs are stable. Continue same medication and careful diet. Cholesterol still very high despite Rx.  Is he remembering to take his pills every day?

## 2012-04-15 ENCOUNTER — Telehealth: Payer: Self-pay | Admitting: *Deleted

## 2012-04-15 MED ORDER — ATORVASTATIN CALCIUM 80 MG PO TABS: 80.0000 mg | ORAL_TABLET | Freq: Every day | ORAL | Status: DC

## 2012-04-15 NOTE — Telephone Encounter (Signed)
Message copied by Burnell Blanks on Wed Apr 15, 2012  1:31 PM ------      Message from: Cassell Clement      Created: Tue Apr 14, 2012 12:42 PM       In view of his markedly elevated LDL I would increase his Lipitor up to 80 mg daily and continue low cholesterol diet

## 2012-04-15 NOTE — Telephone Encounter (Signed)
Advised wife, verbalized understanding.  

## 2012-04-23 NOTE — Telephone Encounter (Signed)
Regis Bill, LPN 16/04/9603 5:40 PM Signed  Advised wife, verbalized understanding Regis Bill, LPN 98/05/9146 8:29 PM Signed  Message copied by Burnell Blanks on Wed Apr 15, 2012 1:31 PM  ------  Message from: Cassell Clement  Created: Tue Apr 14, 2012 12:42 PM  In view of his markedly elevated LDL I would increase his Lipitor up to 80 mg daily and continue low cholesterol diet

## 2012-08-19 DIAGNOSIS — M545 Low back pain: Secondary | ICD-10-CM | POA: Diagnosis not present

## 2012-08-19 DIAGNOSIS — M47817 Spondylosis without myelopathy or radiculopathy, lumbosacral region: Secondary | ICD-10-CM | POA: Diagnosis not present

## 2012-08-19 DIAGNOSIS — M4120 Other idiopathic scoliosis, site unspecified: Secondary | ICD-10-CM | POA: Diagnosis not present

## 2012-08-24 DIAGNOSIS — E7800 Pure hypercholesterolemia, unspecified: Secondary | ICD-10-CM | POA: Diagnosis not present

## 2012-08-24 DIAGNOSIS — F0390 Unspecified dementia without behavioral disturbance: Secondary | ICD-10-CM | POA: Diagnosis not present

## 2012-08-24 DIAGNOSIS — IMO0002 Reserved for concepts with insufficient information to code with codable children: Secondary | ICD-10-CM | POA: Diagnosis not present

## 2012-08-24 DIAGNOSIS — I10 Essential (primary) hypertension: Secondary | ICD-10-CM | POA: Diagnosis not present

## 2012-09-09 DIAGNOSIS — M47817 Spondylosis without myelopathy or radiculopathy, lumbosacral region: Secondary | ICD-10-CM | POA: Diagnosis not present

## 2012-09-09 DIAGNOSIS — M4120 Other idiopathic scoliosis, site unspecified: Secondary | ICD-10-CM | POA: Diagnosis not present

## 2012-09-09 DIAGNOSIS — M545 Low back pain: Secondary | ICD-10-CM | POA: Diagnosis not present

## 2012-09-17 DIAGNOSIS — M545 Low back pain: Secondary | ICD-10-CM | POA: Diagnosis not present

## 2012-09-24 DIAGNOSIS — M545 Low back pain: Secondary | ICD-10-CM | POA: Diagnosis not present

## 2012-09-28 DIAGNOSIS — M545 Low back pain: Secondary | ICD-10-CM | POA: Diagnosis not present

## 2012-09-30 DIAGNOSIS — M545 Low back pain: Secondary | ICD-10-CM | POA: Diagnosis not present

## 2012-10-01 ENCOUNTER — Encounter: Payer: Self-pay | Admitting: Cardiology

## 2012-10-01 ENCOUNTER — Telehealth: Payer: Self-pay | Admitting: *Deleted

## 2012-10-01 ENCOUNTER — Other Ambulatory Visit (INDEPENDENT_AMBULATORY_CARE_PROVIDER_SITE_OTHER): Payer: Medicare Other

## 2012-10-01 ENCOUNTER — Ambulatory Visit (INDEPENDENT_AMBULATORY_CARE_PROVIDER_SITE_OTHER): Payer: Medicare Other | Admitting: Cardiology

## 2012-10-01 VITALS — BP 124/72 | HR 64 | Ht 68.0 in | Wt 172.2 lb

## 2012-10-01 DIAGNOSIS — I25810 Atherosclerosis of coronary artery bypass graft(s) without angina pectoris: Secondary | ICD-10-CM | POA: Diagnosis not present

## 2012-10-01 DIAGNOSIS — E7800 Pure hypercholesterolemia, unspecified: Secondary | ICD-10-CM | POA: Diagnosis not present

## 2012-10-01 DIAGNOSIS — F0390 Unspecified dementia without behavioral disturbance: Secondary | ICD-10-CM | POA: Diagnosis not present

## 2012-10-01 DIAGNOSIS — I119 Hypertensive heart disease without heart failure: Secondary | ICD-10-CM | POA: Diagnosis not present

## 2012-10-01 DIAGNOSIS — I259 Chronic ischemic heart disease, unspecified: Secondary | ICD-10-CM

## 2012-10-01 LAB — BASIC METABOLIC PANEL
CO2: 28 mEq/L (ref 19–32)
Calcium: 9.4 mg/dL (ref 8.4–10.5)
Creatinine, Ser: 0.8 mg/dL (ref 0.4–1.5)
GFR: 95.38 mL/min (ref >60.00)
Glucose, Bld: 101 mg/dL — ABNORMAL HIGH (ref 70–99)
Sodium: 135 mEq/L (ref 135–145)

## 2012-10-01 LAB — LIPID PANEL
HDL: 47.2 mg/dL (ref >39.00)
Total CHOL/HDL Ratio: 3
Triglycerides: 127 mg/dL (ref 0.0–149.0)

## 2012-10-01 LAB — HEPATIC FUNCTION PANEL
ALT: 28 U/L (ref 0–53)
AST: 30 U/L (ref 0–37)
Albumin: 4.3 g/dL (ref 3.5–5.2)
Alkaline Phosphatase: 61 U/L (ref 39–117)

## 2012-10-01 NOTE — Assessment & Plan Note (Signed)
The patient has not been experiencing any severe chest pain.  He takes occasional sublingual nitroglycerin.  He is asking to stop his Plavix.  His cardiac catheterization in 2003 showed minimal nonobstructive disease and he has stress test in 2008 showed no ischemia.  We will continue baby aspirin and stop Plavix.

## 2012-10-01 NOTE — Progress Notes (Signed)
[name] Date of Birth:  [birth year] [facility] [address] [place], [place]  [address] [phone]         Fax   [phone]  History of Present Illness: This pleasant elderly gentleman is seen for a six-month followup office visit. He has a history of ischemic heart disease. He had a cardiac catheterization in 2003 which showed minimal nonobstructive coronary disease. He had a nuclear stress test in 2008 which showed no reversible ischemia. He does have significant hypercholesterolemia despite being on multiple medications. He is trying to here to a low-cholesterol diet. He has a history of normal systolic function with diastolic dysfunction by echo. He does have some early dementia  Since last visit he has had no new cardiac symptoms.  He has had some back pain and has seen Dr. Thana Farr for orthopedics and is presently in a physical therapy rehabilitation for his back symptoms.    Current Outpatient Prescriptions  Medication Sig Dispense Refill  . ALPRAZolam (XANAX) 0.5 MG tablet Take 0.5 mg by mouth as needed.      Marland Kitchen aspirin 81 MG tablet Take 81 mg by mouth as directed. 1 tablet Monday, Wednesday, and Friday only      . atorvastatin (LIPITOR) 80 MG tablet Take 1 tablet (80 mg total) by mouth daily.  90 tablet  3  . donepezil (ARICEPT) 5 MG tablet Take 5 mg by mouth at bedtime.        . fosinopril (MONOPRIL) 40 MG tablet Take 40 mg by mouth daily.        Marland Kitchen LORazepam (ATIVAN) 0.5 MG tablet Take 1 tablet (0.5 mg total) by mouth daily.  90 tablet  3  . Memantine HCl (NAMENDA PO) Take by mouth. Taking twice a day       . metoprolol (TOPROL-XL) 50 MG 24 hr tablet Take 50 mg by mouth daily.        . Multiple Vitamin (MULTIVITAMIN) tablet Take 1 tablet by mouth daily.        . niacin (NIASPAN) 1000 MG CR tablet Take 1 tablet (1,000 mg total) by mouth at bedtime.  90 tablet  4  . triamterene-hydrochlorothiazide (DYAZIDE) 37.5-25 MG per capsule Take 1 capsule by mouth  every morning.        . ezetimibe (ZETIA) 10 MG tablet Take 1 tablet (10 mg total) by mouth daily.  30 tablet  11   No current facility-administered medications for this visit.    Allergies  Allergen Reactions  . Codeine   . Crestor (Rosuvastatin Calcium)   . Lipitor (Atorvastatin Calcium)     Patient Active Problem List  Diagnosis  . Hypercholesterolemia with hypertriglyceridemia  . Chronic ischemic heart disease  . Benign hypertensive heart disease without heart failure  . Dementia  . Chest pain    History  Smoking status  . Former Smoker  . Quit date: 12/31/1990  Smokeless tobacco  . Not on file    History  Alcohol Use No    Family History  Problem Relation Age of Onset  . Alzheimer's disease Mother   . Heart attack Father     Review of Systems: Constitutional: no fever chills diaphoresis or fatigue or change in weight.  Head and neck: no hearing loss, no epistaxis, no photophobia or visual disturbance. Respiratory: No cough, shortness of breath or wheezing. Cardiovascular: No chest pain peripheral edema, palpitations. Gastrointestinal: No abdominal distention, no abdominal pain, no change in bowel habits hematochezia or melena.  Genitourinary: No dysuria, no frequency, no urgency, no nocturia. Musculoskeletal:No arthralgias, no back pain, no gait disturbance or myalgias. Neurological: No dizziness, no headaches, no numbness, no seizures, no syncope, no weakness, no tremors. Hematologic: No lymphadenopathy, no easy bruising. Psychiatric: No confusion, no hallucinations, no sleep disturbance.    Physical Exam: Filed Vitals:   10/01/12 0956  BP: 124/72  Pulse: 64   the general appearance reveals a well-developed well-nourished gentleman in no distress.The head and neck exam reveals pupils equal and reactive.  Extraocular movements are full.  There is no scleral icterus.  The mouth and pharynx are normal.  The neck is supple.  The carotids reveal no bruits.   The jugular venous pressure is normal.  The  thyroid is not enlarged.  There is no lymphadenopathy.  The chest is clear to percussion and auscultation.  There are no rales or rhonchi.  Expansion of the chest is symmetrical.  The precordium is quiet.  The first heart sound is normal.  The second heart sound is physiologically split.  There is no murmur gallop rub or click.  There is no abnormal lift or heave.  The abdomen is soft and nontender.  The bowel sounds are normal.  The liver and spleen are not enlarged.  There are no abdominal masses.  There are no abdominal bruits.  Extremities reveal good pedal pulses.  There is no phlebitis or edema.  There is no cyanosis or clubbing.  Strength is normal and symmetrical in all extremities.  There is no lateralizing weakness.  There are no sensory deficits.  The skin is warm and dry.  There is no rash.  EKG today shows normal sinus rhythm and is within normal limits   Assessment / Plan: Okay to stop Plavix.  Continue to take baby aspirin in a dose of one daily. Recheck in 6 months for office visit lipid panel hepatic function panel and basal metabolic panel.

## 2012-10-01 NOTE — Patient Instructions (Addendum)
Will obtain labs today and call you with the results (lp/bmet/hfp)  STOP PLAVIX  Your physician wants you to follow-up in: 6 months with fasting labs (lp/bmet/hfp) You will receive a reminder letter in the mail two months in advance. If you don't receive a letter, please call our office to schedule the follow-up appointment.

## 2012-10-01 NOTE — Assessment & Plan Note (Signed)
Blood pressure was remaining stable on current therapy 

## 2012-10-01 NOTE — Telephone Encounter (Signed)
After visit today  Dr. Patty Sermons noticed that patient was only taking Aspirin 3 x a week. Called and left message with wife for him to increase to daily

## 2012-10-01 NOTE — Assessment & Plan Note (Signed)
The patient remains quite cheerful and jovial.  His dementia does not appear to have significantly changed since last visit

## 2012-10-02 NOTE — Progress Notes (Signed)
Quick Note:  Please report to patient. The recent labs are stable. Continue same medication and careful diet. The cholesterol is much improved since last visit. The liver function studies are normal. ______

## 2012-10-05 ENCOUNTER — Telehealth: Payer: Self-pay | Admitting: *Deleted

## 2012-10-05 NOTE — Telephone Encounter (Signed)
Advised patient of lab results  

## 2012-10-05 NOTE — Telephone Encounter (Signed)
Message copied by Burnell Blanks on Mon Oct 05, 2012  1:16 PM ------      Message from: Cassell Clement      Created: Fri Oct 02, 2012  9:46 AM       Please report to patient.  The recent labs are stable. Continue same medication and careful diet.  The cholesterol is much improved since last visit.  The liver function studies are normal. ------

## 2012-10-05 NOTE — Telephone Encounter (Signed)
Spoke with wife and she received message and increased

## 2012-12-10 DIAGNOSIS — R413 Other amnesia: Secondary | ICD-10-CM | POA: Diagnosis not present

## 2012-12-10 DIAGNOSIS — Z1331 Encounter for screening for depression: Secondary | ICD-10-CM | POA: Diagnosis not present

## 2012-12-10 DIAGNOSIS — E782 Mixed hyperlipidemia: Secondary | ICD-10-CM | POA: Diagnosis not present

## 2013-01-18 ENCOUNTER — Encounter: Payer: Self-pay | Admitting: Cardiology

## 2013-01-22 ENCOUNTER — Encounter: Payer: Self-pay | Admitting: Cardiology

## 2013-03-30 DIAGNOSIS — Z23 Encounter for immunization: Secondary | ICD-10-CM | POA: Diagnosis not present

## 2013-04-14 ENCOUNTER — Encounter (HOSPITAL_COMMUNITY): Payer: Self-pay | Admitting: Emergency Medicine

## 2013-04-14 ENCOUNTER — Encounter: Payer: Self-pay | Admitting: Cardiology

## 2013-04-14 ENCOUNTER — Emergency Department (HOSPITAL_COMMUNITY)
Admission: EM | Admit: 2013-04-14 | Discharge: 2013-04-14 | Disposition: A | Discharge status: Home / Self Care | Payer: Medicare Other | Attending: Emergency Medicine | Admitting: Emergency Medicine

## 2013-04-14 ENCOUNTER — Emergency Department (HOSPITAL_COMMUNITY): Payer: Medicare Other

## 2013-04-14 DIAGNOSIS — Z9861 Coronary angioplasty status: Secondary | ICD-10-CM | POA: Diagnosis not present

## 2013-04-14 DIAGNOSIS — Z8673 Personal history of transient ischemic attack (TIA), and cerebral infarction without residual deficits: Secondary | ICD-10-CM | POA: Diagnosis not present

## 2013-04-14 DIAGNOSIS — Z7982 Long term (current) use of aspirin: Secondary | ICD-10-CM | POA: Diagnosis not present

## 2013-04-14 DIAGNOSIS — I259 Chronic ischemic heart disease, unspecified: Secondary | ICD-10-CM | POA: Diagnosis not present

## 2013-04-14 DIAGNOSIS — Z87828 Personal history of other (healed) physical injury and trauma: Secondary | ICD-10-CM | POA: Insufficient documentation

## 2013-04-14 DIAGNOSIS — E7800 Pure hypercholesterolemia, unspecified: Secondary | ICD-10-CM | POA: Diagnosis not present

## 2013-04-14 DIAGNOSIS — E785 Hyperlipidemia, unspecified: Secondary | ICD-10-CM | POA: Insufficient documentation

## 2013-04-14 DIAGNOSIS — R0789 Other chest pain: Secondary | ICD-10-CM | POA: Insufficient documentation

## 2013-04-14 DIAGNOSIS — Z8719 Personal history of other diseases of the digestive system: Secondary | ICD-10-CM | POA: Diagnosis not present

## 2013-04-14 DIAGNOSIS — R079 Chest pain, unspecified: Secondary | ICD-10-CM | POA: Diagnosis not present

## 2013-04-14 DIAGNOSIS — Z87891 Personal history of nicotine dependence: Secondary | ICD-10-CM | POA: Diagnosis not present

## 2013-04-14 DIAGNOSIS — Z79899 Other long term (current) drug therapy: Secondary | ICD-10-CM | POA: Diagnosis not present

## 2013-04-14 DIAGNOSIS — F0390 Unspecified dementia without behavioral disturbance: Secondary | ICD-10-CM | POA: Insufficient documentation

## 2013-04-14 DIAGNOSIS — I4949 Other premature depolarization: Secondary | ICD-10-CM | POA: Insufficient documentation

## 2013-04-14 DIAGNOSIS — G3184 Mild cognitive impairment, so stated: Secondary | ICD-10-CM | POA: Diagnosis not present

## 2013-04-14 DIAGNOSIS — I10 Essential (primary) hypertension: Secondary | ICD-10-CM | POA: Diagnosis not present

## 2013-04-14 DIAGNOSIS — J8410 Pulmonary fibrosis, unspecified: Secondary | ICD-10-CM | POA: Diagnosis not present

## 2013-04-14 LAB — BASIC METABOLIC PANEL
BUN: 19 mg/dL (ref 6–23)
CO2: 27 mEq/L (ref 19–32)
Chloride: 104 mEq/L (ref 96–112)
Creatinine, Ser: 0.86 mg/dL (ref 0.50–1.35)
Glucose, Bld: 83 mg/dL (ref 70–99)

## 2013-04-14 LAB — CBC
HCT: 42.8 % (ref 39.0–52.0)
Hemoglobin: 14.7 g/dL (ref 13.0–17.0)
MCH: 32.3 pg (ref 26.0–34.0)
MCV: 94.1 fL (ref 78.0–100.0)
RBC: 4.55 MIL/uL (ref 4.22–5.81)
WBC: 7 10*3/uL (ref 4.0–10.5)

## 2013-04-14 LAB — D-DIMER, QUANTITATIVE (NOT AT ARMC): D-Dimer, Quant: 0.63 ug/mL-FEU — ABNORMAL HIGH (ref 0.00–0.48)

## 2013-04-14 MED ORDER — IOHEXOL 350 MG/ML SOLN
100.0000 mL | Freq: Once | INTRAVENOUS | Status: AC | PRN
  Administered 2013-04-14: 100 mL via INTRAVENOUS

## 2013-04-14 MED ORDER — SODIUM CHLORIDE 0.9 % IV SOLN
INTRAVENOUS | Status: DC
  Administered 2013-04-14: 15:00:00 via INTRAVENOUS

## 2013-04-14 MED ORDER — TRAMADOL HCL 50 MG PO TABS: 50.0000 mg | ORAL_TABLET | Freq: Four times a day (QID) | ORAL | Status: DC | PRN

## 2013-04-14 MED ORDER — IBUPROFEN 200 MG PO TABS
400.0000 mg | ORAL_TABLET | Freq: Once | ORAL | Status: AC
  Administered 2013-04-14: 400 mg via ORAL
  Filled 2013-04-14: qty 2

## 2013-04-14 NOTE — ED Provider Notes (Signed)
CSN: [id number]     Arrival date & time 04/14/13  1220 History   First MD Initiated Contact with Patient 04/14/13 1247     Chief Complaint  Patient presents with  . Chest Pain   (Consider location/radiation/quality/duration/timing/severity/associated sxs/prior Treatment) HPI Patient was sent to the ED for possible new changes on his EKG. He states he's been having left-sided chest pain off and on for the past 1-1/2 weeks. He states the pain is just above his left nipple and sometimes goes up into his shoulder and into his upper arm. He states normally the pain comes and goes however it has been constant today. He denies any known injury or change in his activity. He denies shortness of breath, coughing although his wife says he does cough, fever, nausea, vomiting, diaphoresis, or swelling in his legs or pain in his legs. He states activity makes it worse. He states nothing makes it feel better. He states his current pain as a 3/10 and at its worst was a 5/10.  PCP Dr Erby Pian Cardiologist Dr Patty Sermons  Past Medical History  Diagnosis Date  . Hypertension   . Hyperlipidemia   . LV dysfunction     EF normal per echo in 2005.   . IHD (ischemic heart disease)     remote cath in 2003 with mild nonobstructive disease. negative nuclear in 2008  . Constipation   . Bruising   . Forgetfulness   . Dementia     on Aricept and Namenda  . PVC's (premature ventricular contractions)   . Ventricular arrhythmia    Past Surgical History  Procedure Laterality Date  . Cardiac catheterization  03/21/2002    MILD GLOBAL HYPOKINESIA. EF 50%  . US echocardiography  2005    Mild AS/AI, Mild MR with a normal EF  . Tonsillectomy    . Wolf[place] surgery      RIGHT [place]   Family History  Problem Relation Age of Onset  . Alzheimer's disease Mother   . Heart attack Father    History  Substance Use Topics  . Smoking status: Former Smoker    Quit date: 12/31/1990  . Smokeless tobacco: Not on file  . Alcohol  Use: No   lives at home Lives with wife   Review of Systems  All other systems reviewed and are negative.    Allergies  Codeine; Crestor; and Lipitor  Home Medications   Current Outpatient Rx  Name  Route  Sig  Dispense  Refill  . acetaminophen (TYLENOL) 650 MG CR tablet   Oral   Take 650 mg by mouth every 8 (eight) hours as needed for pain.         Marland Kitchen ALPRAZolam (XANAX) 0.5 MG tablet   Oral   Take 0.25 mg by mouth at bedtime as needed for sleep.          Marland Kitchen aspirin 81 MG tablet   Oral   Take 81 mg by mouth daily.          Marland Kitchen atorvastatin (LIPITOR) 80 MG tablet   Oral   Take 1 tablet (80 mg total) by mouth daily.   90 tablet   3     THIS IS AS NEW DOSE, DISCONTINUE REFILLS ON THE LI .Marland Kitchen.   . donepezil (ARICEPT) 5 MG tablet   Oral   Take 5 mg by mouth at bedtime.           . fosinopril (MONOPRIL) 40 MG tablet   Oral  Take 40 mg by mouth daily.           . memantine (NAMENDA) 10 MG tablet   Oral   Take 10 mg by mouth 2 (two) times daily.         . metoprolol (TOPROL-XL) 50 MG 24 hr tablet   Oral   Take 50 mg by mouth daily.           . Multiple Vitamin (MULTIVITAMIN) tablet   Oral   Take 1 tablet by mouth daily.           . niacin (NIASPAN) 1000 MG CR tablet   Oral   Take 1 tablet (1,000 mg total) by mouth at bedtime.   90 tablet   4    BP 140/106  Pulse 57  Temp(Src) 97.8 F (36.6 C) (Oral)  Resp 20  SpO2 96%  Vital signs normal except bradycardia  Physical Exam  Nursing note and vitals reviewed. Constitutional: He is oriented to person, place, and time. He appears well-developed and well-nourished.  Non-toxic appearance. He does not appear ill. No distress.  HENT:  Head: Normocephalic and atraumatic.  Right Ear: External ear normal.  Left Ear: External ear normal.  Nose: Nose normal. No mucosal edema or rhinorrhea.  Mouth/Throat: Oropharynx is clear and moist and mucous membranes are normal. No dental abscesses or uvula  swelling.  Eyes: Conjunctivae and EOM are normal. Pupils are equal, round, and reactive to light.  Neck: Normal range of motion and full passive range of motion without pain. Neck supple.  Cardiovascular: Normal rate, regular rhythm and normal heart sounds.  Exam reveals no gallop and no friction rub.   No murmur heard. Pulmonary/Chest: Effort normal and breath sounds normal. No respiratory distress. He has no wheezes. He has no rhonchi. He has no rales. He exhibits no tenderness and no crepitus.    Area of pain noted, he is nontender to palpation.  Abdominal: Soft. Normal appearance and bowel sounds are normal. He exhibits no distension. There is no tenderness. There is no rebound and no guarding.  Musculoskeletal: Normal range of motion. He exhibits no edema and no tenderness.  Moves all extremities well.   Neurological: He is alert and oriented to person, place, and time. He has normal strength. No cranial nerve deficit.  Skin: Skin is warm, dry and intact. No rash noted. No erythema. No pallor.  Psychiatric: He has a normal mood and affect. His speech is normal and behavior is normal. His mood appears not anxious.    ED Course  Procedures (including critical care time)  Medications  0.9 %  sodium chloride infusion ( Intravenous New Bag/Given 04/14/13 1528)  ibuprofen (ADVIL,MOTRIN) tablet 400 mg (400 mg Oral Given 04/14/13 1437)  iohexol (OMNIPAQUE) 350 MG/ML injection 100 mL (100 mLs Intravenous Contrast Given 04/14/13 1606)   Pt had a stress test in 2008 that was normal. Cardiac cath in 2003 showed EF of 50% with mild global hypokinesia  Although his outpatient EKG from patient's doctor's office was read as a incomplete left bundle branch block it is unchanged from their prior EKG and the EKG is unchanged from the one in the ED today and from 2009.   Discussed his lab results, is agreeable to get a CT angio chest to R/O PE.   Pt family given test results.   Labs Review Results  for orders placed during the hospital encounter of 04/14/13  CBC      Result Value Range  WBC 7.0  4.0 - 10.5 K/uL   RBC 4.55  4.22 - 5.81 MIL/uL   Hemoglobin 14.7  13.0 - 17.0 g/dL   HCT 16.1  09.6 - 04.5 %   MCV 94.1  78.0 - 100.0 fL   MCH 32.3  26.0 - 34.0 pg   MCHC 34.3  30.0 - 36.0 g/dL   RDW 40.9  81.1 - 91.4 %   Platelets 126 (*) 150 - 400 K/uL  BASIC METABOLIC PANEL      Result Value Range   Sodium 138  135 - 145 mEq/L   Potassium 4.1  3.5 - 5.1 mEq/L   Chloride 104  96 - 112 mEq/L   CO2 27  19 - 32 mEq/L   Glucose, Bld 83  70 - 99 mg/dL   BUN 19  6 - 23 mg/dL   Creatinine, Ser 7.82  0.50 - 1.35 mg/dL   Calcium 9.6  8.4 - 95.6 mg/dL   GFR calc non Af Amer 82 (*) >90 mL/min   GFR calc Af Amer >90  >90 mL/min  D-DIMER, QUANTITATIVE      Result Value Range   D-Dimer, Quant 0.63 (*) 0.00 - 0.48 ug/mL-FEU  POCT I-STAT TROPONIN I      Result Value Range   Troponin i, poc 0.00  0.00 - 0.08 ng/mL   Comment 3            Laboratory interpretation all normal except except elevated d-dimer      Imaging Review Dg Chest 2 View  04/14/2013   CLINICAL DATA:  Left upper chest pain today. Ex-smoker. History of hypertension.  EXAM: CHEST  2 VIEW  COMPARISON:  08/29/2011.  FINDINGS: The heart size and mediastinal contours are stable. There is chronic lung disease with interval increased interstitial prominence, suspicious for superimposed edema or bronchitis. There is no consolidation or significant pleural effusion. At lower thoracic compression deformity appears unchanged.  IMPRESSION: Increased interstitial prominence compared with prior study, suspicious for edema or bronchitis superimposed on chronic obstructive lung disease.   Electronically Signed   By: Roxy Horseman M.D.   On: 04/14/2013 13:42   Ct Angio Chest W/cm &/or Wo Cm  04/14/2013   CLINICAL DATA:  Left chest pain for 1.5 weeks. Ex-smoker per elevated D-dimer levels. Question pulmonary embolism.  EXAM: CT ANGIOGRAPHY  CHEST WITH CONTRAST  TECHNIQUE: Multidetector CT imaging of the chest was performed using the standard protocol during bolus administration of intravenous contrast. Multiplanar CT image reconstructions including MIPs were obtained to evaluate the vascular anatomy.  CONTRAST:  OMNIPAQUE IOHEXOL 350 MG/ML SOLN  COMPARISON:  Radiographs today and 08/29/2011.  FINDINGS: The pulmonary arteries are well opacified with contrast. There is no evidence of acute pulmonary embolism. There is diffuse atherosclerosis of the aorta, great vessels and coronary arteries.  No enlarged mediastinal or hilar lymph nodes are demonstrated. The esophagus appears normal. There is no significant pleural or pericardial effusion.  Mild fibrotic changes are present in both lung bases. There is no confluent airspace opacity, endobronchial lesion or evidence of pulmonary nodule.  Images through the upper abdomen demonstrate no suspicious findings. There is a probable small cyst within the left hepatic lobe. A chronic T11 compression deformity appears unchanged.  Review of the MIP images confirms the above findings.  IMPRESSION: 1. No evidence of acute pulmonary embolism or other acute chest process. 2. Atherosclerosis as described. 3. Mild pulmonary fibrotic changes. 4. Stable chronic T11 compression deformity.   Electronically  Signed   By: Roxy Horseman M.D.   On: 04/14/2013 16:29     Date: 04/14/2013  Rate: 63  Rhythm: normal sinus rhythm and premature ventricular contractions (PVC)  QRS Axis: normal  Intervals: normal  ST/T Wave abnormalities: normal  Conduction Disutrbances:none  Narrative Interpretation:   Old EKG Reviewed: unchanged from EKG 03/16/2008  Compared to EKG from doctors office earlier today NSCS.    MDM   1. Chest pain, atypical     New Prescriptions   TRAMADOL (ULTRAM) 50 MG TABLET    Take 1 tablet (50 mg total) by mouth every 6 (six) hours as needed for pain.    Plan discharge   Devoria Albe, MD,  Franz Dell, MD 04/14/13 [phone]

## 2013-04-14 NOTE — ED Notes (Addendum)
Pt presents with left sided chest pain that began 1.5 weeks ago. Pt denies shortness of breath, dizziness, lightheaded, nausea, emesis, or radiation of pain. Pt reports a history of smoking one pack per day, which he quit in the 1980's. Pt has a history of hypertension, high cholesterol, TIA, and stroke. Pt has baseline of mild cognitive impairment with memory loss. Pt presents with EKG from PCP with an interpretation of incomplete left bundle block, which is a mild change from a prior EKG in 2013.

## 2013-05-03 ENCOUNTER — Encounter: Payer: Self-pay | Admitting: Cardiology

## 2013-05-03 ENCOUNTER — Ambulatory Visit (INDEPENDENT_AMBULATORY_CARE_PROVIDER_SITE_OTHER): Payer: Medicare Other | Admitting: Cardiology

## 2013-05-03 VITALS — BP 138/70 | HR 72 | Ht 68.0 in | Wt 177.8 lb

## 2013-05-03 DIAGNOSIS — I259 Chronic ischemic heart disease, unspecified: Secondary | ICD-10-CM

## 2013-05-03 DIAGNOSIS — I119 Hypertensive heart disease without heart failure: Secondary | ICD-10-CM | POA: Diagnosis not present

## 2013-05-03 DIAGNOSIS — I700 Atherosclerosis of aorta: Secondary | ICD-10-CM | POA: Diagnosis not present

## 2013-05-03 DIAGNOSIS — I4949 Other premature depolarization: Secondary | ICD-10-CM

## 2013-05-03 DIAGNOSIS — E782 Mixed hyperlipidemia: Secondary | ICD-10-CM

## 2013-05-03 DIAGNOSIS — I493 Ventricular premature depolarization: Secondary | ICD-10-CM | POA: Insufficient documentation

## 2013-05-03 NOTE — Assessment & Plan Note (Signed)
Blood pressure has been remaining stable on current medication.  No symptoms of CHF.  No dizziness or syncope.

## 2013-05-03 NOTE — Progress Notes (Signed)
[name] Date of Birth:  1932-10-08 [address] [place], [place]  [address] [phone]         Fax   [phone]  History of Present Illness: This pleasant elderly gentleman is seen for a six-month followup office visit. He has a history of ischemic heart disease. He had a cardiac catheterization in 2003 which showed minimal nonobstructive coronary disease. He had a nuclear stress test in 2008 which showed no reversible ischemia. He does have significant hypercholesterolemia despite being on multiple medications. He is trying to here to a low-cholesterol diet. He has a history of normal systolic function with diastolic dysfunction by echo. He does have some early dementia  On 04/14/13 the patient had chest pain and went to the emergency room where he was evaluated.  His EKG showed no acute changes and there were occasional PVCs.  He had a CT angiogram of the chest which showed no evidence of pulmonary emboli.  Atherosclerosis of the aorta was noted.  He did not have to be admitted   Current Outpatient Prescriptions  Medication Sig Dispense Refill  . acetaminophen (TYLENOL) 650 MG CR tablet Take 650 mg by mouth every 8 (eight) hours as needed for pain.      Marland Kitchen ALPRAZolam (XANAX) 0.5 MG tablet Take 0.25 mg by mouth at bedtime as needed for sleep.       Marland Kitchen aspirin 81 MG tablet Take 81 mg by mouth daily.       Marland Kitchen atorvastatin (LIPITOR) 80 MG tablet Take 1 tablet (80 mg total) by mouth daily.  90 tablet  3  . donepezil (ARICEPT) 5 MG tablet Take 5 mg by mouth at bedtime.        . fosinopril (MONOPRIL) 40 MG tablet Take 40 mg by mouth daily.        . memantine (NAMENDA) 10 MG tablet Take 10 mg by mouth 2 (two) times daily.      . metoprolol (TOPROL-XL) 50 MG 24 hr tablet Take 50 mg by mouth daily.        . Multiple Vitamin (MULTIVITAMIN) tablet Take 1 tablet by mouth daily.        . traMADol (ULTRAM) 50 MG tablet Take 1 tablet (50 mg total) by mouth every 6 (six) hours as  needed for pain.  15 tablet  0   No current facility-administered medications for this visit.    Allergies  Allergen Reactions  . Codeine Other (See Comments)    Anxiety and nervous feelings   . Crestor [Rosuvastatin Calcium] Other (See Comments)    Muscle pains in knees  . Lipitor [Atorvastatin Calcium] Other (See Comments)    Muscle pains in knees    Patient Active Problem List   Diagnosis Date Noted  . Atherosclerosis of aorta 05/03/2013  . PVC (premature ventricular contraction) 05/03/2013  . Chest pain 08/30/2011  . Hypercholesterolemia with hypertriglyceridemia 01/09/2011  . Chronic ischemic heart disease 01/09/2011  . Benign hypertensive heart disease without heart failure 01/09/2011  . Dementia 01/09/2011    History  Smoking status  . Former Smoker  . Quit date: 12/31/1990  Smokeless tobacco  . Not on file    History  Alcohol Use No    Family History  Problem Relation Age of Onset  . Alzheimer's disease Mother   . Heart attack Father     Review of Systems: Constitutional: no fever chills diaphoresis or fatigue or change in weight.  Head and neck: no hearing  loss, no epistaxis, no photophobia or visual disturbance. Respiratory: No cough, shortness of breath or wheezing. Cardiovascular: No chest pain peripheral edema, palpitations. Gastrointestinal: No abdominal distention, no abdominal pain, no change in bowel habits hematochezia or melena. Genitourinary: No dysuria, no frequency, no urgency, no nocturia. Musculoskeletal:No arthralgias, no back pain, no gait disturbance or myalgias. Neurological: No dizziness, no headaches, no numbness, no seizures, no syncope, no weakness, no tremors. Hematologic: No lymphadenopathy, no easy bruising. Psychiatric: No confusion, no hallucinations, no sleep disturbance.    Physical Exam: Filed Vitals:   05/03/13 1439  BP: 138/70  Pulse: 72   the general appearance reveals a well-developed well-nourished gentleman  in no distress.The head and neck exam reveals pupils equal and reactive.  Extraocular movements are full.  There is no scleral icterus.  The mouth and pharynx are normal.  The neck is supple.  The carotids reveal no bruits.  The jugular venous pressure is normal.  The  thyroid is not enlarged.  There is no lymphadenopathy.  The chest is clear to percussion and auscultation.  There are no rales or rhonchi.  Expansion of the chest is symmetrical.  The precordium is quiet.  The first heart sound is normal.  The second heart sound is physiologically split.  There is no murmur gallop rub or click.  There is no abnormal lift or heave.  The abdomen is soft and nontender.  The bowel sounds are normal.  The liver and spleen are not enlarged.  There are no abdominal masses.  There are no abdominal bruits.  Extremities reveal good pedal pulses.  There is no phlebitis or edema.  There is no cyanosis or clubbing.  Strength is normal and symmetrical in all extremities.  There is no lateralizing weakness.  There are no sensory deficits.  The skin is warm and dry.  There is no rash.     Assessment / Plan: Continue same medication except stop Niaspan after current bottle. Recheck in 6 months for followup office visit

## 2013-05-03 NOTE — Assessment & Plan Note (Signed)
Since his emergency room visit he has not experienced any further severe chest discomfort

## 2013-05-03 NOTE — Assessment & Plan Note (Signed)
The patient had recent lab work at Dr. Sonny Masters Smith's office on 04/14/13.  His LDL cholesterol is 70 and total cholesterol 142.  He no longer needs to continue to take Niaspan.  He will continue Lipitor only.

## 2013-05-03 NOTE — Patient Instructions (Signed)
STOP NIACIN  Keep other medications the same  Your physician wants you to follow-up in: 6 month ov You will receive a reminder letter in the mail two months in advance. If you don't receive a letter, please call our office to schedule the follow-up appointment.

## 2013-05-13 DIAGNOSIS — D696 Thrombocytopenia, unspecified: Secondary | ICD-10-CM | POA: Diagnosis not present

## 2013-08-16 ENCOUNTER — Ambulatory Visit
Admission: RE | Admit: 2013-08-16 | Discharge: 2013-08-16 | Disposition: A | Discharge status: Home / Self Care | Payer: Medicare Other | Source: Ambulatory Visit | Attending: Family Medicine | Admitting: Family Medicine

## 2013-08-16 ENCOUNTER — Other Ambulatory Visit: Payer: Self-pay | Admitting: Family Medicine

## 2013-08-16 DIAGNOSIS — F29 Unspecified psychosis not due to a substance or known physiological condition: Secondary | ICD-10-CM | POA: Diagnosis not present

## 2013-08-16 DIAGNOSIS — S0990XA Unspecified injury of head, initial encounter: Secondary | ICD-10-CM

## 2013-08-16 DIAGNOSIS — H539 Unspecified visual disturbance: Secondary | ICD-10-CM | POA: Diagnosis not present

## 2013-08-23 DIAGNOSIS — S0990XA Unspecified injury of head, initial encounter: Secondary | ICD-10-CM | POA: Diagnosis not present

## 2013-08-23 DIAGNOSIS — S20219A Contusion of unspecified front wall of thorax, initial encounter: Secondary | ICD-10-CM | POA: Diagnosis not present

## 2013-08-23 DIAGNOSIS — Z9181 History of falling: Secondary | ICD-10-CM | POA: Diagnosis not present

## 2013-11-03 ENCOUNTER — Ambulatory Visit (INDEPENDENT_AMBULATORY_CARE_PROVIDER_SITE_OTHER): Payer: Medicare Other | Admitting: Cardiology

## 2013-11-03 ENCOUNTER — Encounter: Payer: Self-pay | Admitting: Cardiology

## 2013-11-03 VITALS — BP 122/74 | HR 66 | Ht 68.0 in | Wt 176.8 lb

## 2013-11-03 DIAGNOSIS — E782 Mixed hyperlipidemia: Secondary | ICD-10-CM

## 2013-11-03 DIAGNOSIS — I4949 Other premature depolarization: Secondary | ICD-10-CM | POA: Diagnosis not present

## 2013-11-03 DIAGNOSIS — I119 Hypertensive heart disease without heart failure: Secondary | ICD-10-CM

## 2013-11-03 DIAGNOSIS — I259 Chronic ischemic heart disease, unspecified: Secondary | ICD-10-CM

## 2013-11-03 DIAGNOSIS — I493 Ventricular premature depolarization: Secondary | ICD-10-CM

## 2013-11-03 NOTE — Progress Notes (Signed)
[name] Date of Birth:  10/14/1932 [facility] [address] [place], [place]  [address] [phone]        Fax   [phone]   History of Present Illness: This pleasant elderly gentleman is seen for a six-month followup office visit. He has a history of ischemic heart disease. He had a cardiac catheterization in 2003 which showed minimal nonobstructive coronary disease. He had a nuclear stress test in 2008 which showed no reversible ischemia. He does have significant hypercholesterolemia despite being on multiple medications. He is trying to adhere to a low-cholesterol diet. He has a history of normal systolic function with diastolic dysfunction by echo. He does have some early dementia  On 04/14/13 the patient had chest pain and went to the emergency room where he was evaluated. His EKG showed no acute changes and there were occasional PVCs. He had a CT angiogram of the chest which showed no evidence of pulmonary emboli. Atherosclerosis of the aorta was noted. He did not have to be admitted.  Since then he has not had any his severe chest pain.  He has occasional chest discomfort less than once a week.  Usually the pain resolves without specific therapy. The patient's dementia is gradually worsening.  He no longer takes Aricept.  He is on Namenda XR instead.   Current Outpatient Prescriptions  Medication Sig Dispense Refill  . ALPRAZolam (XANAX) 0.5 MG tablet Take 0.25 mg by mouth at bedtime as needed for sleep.       Marland Kitchen aspirin 81 MG tablet Take 81 mg by mouth daily.       Marland Kitchen atorvastatin (LIPITOR) 80 MG tablet Take 1 tablet (80 mg total) by mouth daily.  90 tablet  3  . donepezil (ARICEPT) 5 MG tablet Take 5 mg by mouth at bedtime.        . metoprolol (TOPROL-XL) 50 MG 24 hr tablet Take 50 mg by mouth daily.        . Multiple Vitamin (MULTIVITAMIN) tablet Take 1 tablet by mouth daily.         No current facility-administered medications for this visit.     Allergies  Allergen Reactions  . Codeine Other (See Comments)    Anxiety and nervous feelings   . Crestor [Rosuvastatin Calcium] Other (See Comments)    Muscle pains in knees  . Lipitor [Atorvastatin Calcium] Other (See Comments)    Muscle pains in knees    Patient Active Problem List   Diagnosis Date Noted  . Atherosclerosis of aorta 05/03/2013  . PVC (premature ventricular contraction) 05/03/2013  . Chest pain 08/30/2011  . Hypercholesterolemia with hypertriglyceridemia 01/09/2011  . Chronic ischemic heart disease 01/09/2011  . Benign hypertensive heart disease without heart failure 01/09/2011  . Dementia 01/09/2011    History  Smoking status  . Former Smoker  . Quit date: 12/31/1990  Smokeless tobacco  . Not on file    History  Alcohol Use No    Family History  Problem Relation Age of Onset  . Alzheimer's disease Mother   . Heart attack Father     Review of Systems: Constitutional: no fever chills diaphoresis or fatigue or change in weight.  Head and neck: no hearing loss, no epistaxis, no photophobia or visual disturbance. Respiratory: No cough, shortness of breath or wheezing. Cardiovascular: No chest pain peripheral edema, palpitations. Gastrointestinal: No abdominal distention, no abdominal pain, no change in bowel habits hematochezia or melena. Genitourinary: No dysuria, no  frequency, no urgency, no nocturia. Musculoskeletal:No arthralgias, no back pain, no gait disturbance or myalgias. Neurological: No dizziness, no headaches, no numbness, no seizures, no syncope, no weakness, no tremors. Hematologic: No lymphadenopathy, no easy bruising. Psychiatric: No confusion, no hallucinations, no sleep disturbance.    Physical Exam: Filed Vitals:   11/03/13 1517  BP: 122/74  Pulse: 66   the general appearance reveals a well-developed well-nourished pleasant gentleman in no distress.The head and neck exam reveals pupils equal and reactive.  Extraocular  movements are full.  There is no scleral icterus.  The mouth and pharynx are normal.  The neck is supple.  The carotids reveal no bruits.  The jugular venous pressure is normal.  The  thyroid is not enlarged.  There is no lymphadenopathy.  The chest is clear to percussion and auscultation.  There are no rales or rhonchi.  Expansion of the chest is symmetrical.  The precordium is quiet.  The first heart sound is normal.  The second heart sound is physiologically split.  There is no murmur gallop rub or click.  There is no abnormal lift or heave.  The abdomen is soft and nontender.  The bowel sounds are normal.  The liver and spleen are not enlarged.  There are no abdominal masses.  There are no abdominal bruits.  Extremities reveal good pedal pulses.  There is no phlebitis or edema.  There is no cyanosis or clubbing.  Strength is normal and symmetrical in all extremities.  There is no lateralizing weakness.  There are no sensory deficits.  The skin is warm and dry.  There is no rash.     Assessment / Plan: 1. chest pain with nonobstructive coronary disease 2. benign hypertensive heart disease without heart failure 3.  PVCs  4. Hypercholesterolemia 5. atherosclerosis of aorta 6. memory disorder  Plan: Continue same medication.  Recheck in 6 months

## 2013-11-03 NOTE — Assessment & Plan Note (Signed)
The patient notices occasional PVCs.  He has not had any sustained tachycardia

## 2013-11-03 NOTE — Assessment & Plan Note (Signed)
Blood pressure has been remaining stable.  No symptoms of CHF.  No headaches or dizziness. He gets exercise by doing his own yard work. he has a Best boy

## 2013-11-03 NOTE — Assessment & Plan Note (Signed)
Patient remains on Lipitor 80 mg daily for his hypercholesterolemia.  His blood work is monitored by his PCP Dr. Carol Ada.

## 2013-11-03 NOTE — Patient Instructions (Signed)
Your physician recommends that you continue on your current medications as directed. Please refer to the Current Medication list given to you today.  Your physician wants you to follow-up in: 6 month ov You will receive a reminder letter in the mail two months in advance. If you don't receive a letter, please call our office to schedule the follow-up appointment.  

## 2013-12-21 DIAGNOSIS — Z23 Encounter for immunization: Secondary | ICD-10-CM | POA: Diagnosis not present

## 2013-12-21 DIAGNOSIS — E7800 Pure hypercholesterolemia, unspecified: Secondary | ICD-10-CM | POA: Diagnosis not present

## 2013-12-21 DIAGNOSIS — M545 Low back pain, unspecified: Secondary | ICD-10-CM | POA: Diagnosis not present

## 2013-12-21 DIAGNOSIS — F0390 Unspecified dementia without behavioral disturbance: Secondary | ICD-10-CM | POA: Diagnosis not present

## 2013-12-21 DIAGNOSIS — I10 Essential (primary) hypertension: Secondary | ICD-10-CM | POA: Diagnosis not present

## 2014-03-01 DIAGNOSIS — F0390 Unspecified dementia without behavioral disturbance: Secondary | ICD-10-CM | POA: Diagnosis not present

## 2014-03-01 DIAGNOSIS — E7800 Pure hypercholesterolemia, unspecified: Secondary | ICD-10-CM | POA: Diagnosis not present

## 2014-03-01 DIAGNOSIS — Z23 Encounter for immunization: Secondary | ICD-10-CM | POA: Diagnosis not present

## 2014-03-01 DIAGNOSIS — Z1331 Encounter for screening for depression: Secondary | ICD-10-CM | POA: Diagnosis not present

## 2014-03-01 DIAGNOSIS — Z1211 Encounter for screening for malignant neoplasm of colon: Secondary | ICD-10-CM | POA: Diagnosis not present

## 2014-03-01 DIAGNOSIS — I10 Essential (primary) hypertension: Secondary | ICD-10-CM | POA: Diagnosis not present

## 2014-03-01 DIAGNOSIS — Z0000 Encounter for general adult medical examination without abnormal findings: Secondary | ICD-10-CM | POA: Diagnosis not present

## 2014-03-01 DIAGNOSIS — N402 Nodular prostate without lower urinary tract symptoms: Secondary | ICD-10-CM | POA: Diagnosis not present

## 2014-03-11 DIAGNOSIS — N400 Enlarged prostate without lower urinary tract symptoms: Secondary | ICD-10-CM | POA: Diagnosis not present

## 2014-03-11 DIAGNOSIS — N402 Nodular prostate without lower urinary tract symptoms: Secondary | ICD-10-CM | POA: Diagnosis not present

## 2014-03-28 ENCOUNTER — Telehealth: Payer: Self-pay | Admitting: Cardiology

## 2014-03-28 NOTE — Telephone Encounter (Signed)
New problem:    Per pt wife PCP wants wife  to be seen sooner please give her a call back.  She would like hus's appt same day

## 2014-03-28 NOTE — Telephone Encounter (Signed)
Explained to wife unable to get her and her husband same day appointment this week since she was the one having issues.  Verbalized understanding

## 2014-04-14 ENCOUNTER — Encounter: Payer: Self-pay | Admitting: Cardiology

## 2014-04-14 ENCOUNTER — Ambulatory Visit (INDEPENDENT_AMBULATORY_CARE_PROVIDER_SITE_OTHER): Payer: Medicare Other | Admitting: Cardiology

## 2014-04-14 VITALS — BP 114/74 | HR 58 | Ht 64.0 in | Wt 176.0 lb

## 2014-04-14 DIAGNOSIS — I259 Chronic ischemic heart disease, unspecified: Secondary | ICD-10-CM

## 2014-04-14 DIAGNOSIS — E782 Mixed hyperlipidemia: Secondary | ICD-10-CM | POA: Diagnosis not present

## 2014-04-14 DIAGNOSIS — I493 Ventricular premature depolarization: Secondary | ICD-10-CM

## 2014-04-14 DIAGNOSIS — I119 Hypertensive heart disease without heart failure: Secondary | ICD-10-CM | POA: Diagnosis not present

## 2014-04-14 NOTE — Progress Notes (Signed)
[name] Date of Birth:  August 02, 1932 [facility] [address] [place], [place]  [address] [phone]        Fax   [phone]   History of Present Illness: This pleasant 80 year old gentleman is seen for a six-month followup office visit. He has a history of ischemic heart disease. He had a cardiac catheterization in 2003 which showed minimal nonobstructive coronary disease. He had a nuclear stress test in 2008 which showed no reversible ischemia. He does have significant hypercholesterolemia despite being on multiple medications. He is trying to adhere to a low-cholesterol diet. He has a history of normal systolic function with diastolic dysfunction by echo. He does have some early dementia  On 04/14/13 the patient had chest pain and went to the emergency room where he was evaluated. His EKG showed no acute changes and there were occasional PVCs. He had a CT angiogram of the chest which showed no evidence of pulmonary emboli. Atherosclerosis of the aorta was noted. He did not have to be admitted.  Since then he has not had any his severe chest pain.  He has occasional chest discomfort less than once a week.  Usually the pain resolves without specific therapy. The patient's dementia is gradually worsening.  He no longer takes Aricept.  He is on Namenda XR instead. His lipids are checked by his PCP Dr. Carol Ada.  Current Outpatient Prescriptions  Medication Sig Dispense Refill  . ALPRAZolam (XANAX) 0.5 MG tablet Take 0.25 mg by mouth at bedtime as needed for sleep.       Marland Kitchen aspirin 81 MG tablet Take 81 mg by mouth daily.       Marland Kitchen atorvastatin (LIPITOR) 80 MG tablet Take 1 tablet (80 mg total) by mouth daily.  90 tablet  3  . donepezil (ARICEPT) 5 MG tablet Take 5 mg by mouth at bedtime.        . metoprolol (TOPROL-XL) 50 MG 24 hr tablet Take 50 mg by mouth daily.        . Misc Natural Products (OSTEO BI-FLEX ADV JOINT SHIELD PO) Take by mouth.      . Multiple  Vitamin (MULTIVITAMIN) tablet Take 1 tablet by mouth daily.        Marland Kitchen NAMENDA XR 28 MG CP24        No current facility-administered medications for this visit.    Allergies  Allergen Reactions  . Codeine Other (See Comments)    Anxiety and nervous feelings   . Crestor [Rosuvastatin Calcium] Other (See Comments)    Muscle pains in knees  . Lipitor [Atorvastatin Calcium] Other (See Comments)    Muscle pains in knees    Patient Active Problem List   Diagnosis Date Noted  . Atherosclerosis of aorta 05/03/2013  . PVC (premature ventricular contraction) 05/03/2013  . Chest pain 08/30/2011  . Hypercholesterolemia with hypertriglyceridemia 01/09/2011  . Chronic ischemic heart disease 01/09/2011  . Benign hypertensive heart disease without heart failure 01/09/2011  . Dementia 01/09/2011    History  Smoking status  . Former Smoker  . Quit date: 12/31/1990  Smokeless tobacco  . Not on file    History  Alcohol Use No    Family History  Problem Relation Age of Onset  . Alzheimer's disease Mother   . Heart attack Father     Review of Systems: Constitutional: no fever chills diaphoresis or fatigue or change in weight.  Head and neck: no hearing loss, no epistaxis,  no photophobia or visual disturbance. Respiratory: No cough, shortness of breath or wheezing. Cardiovascular: No chest pain peripheral edema, palpitations. Gastrointestinal: No abdominal distention, no abdominal pain, no change in bowel habits hematochezia or melena. Genitourinary: No dysuria, no frequency, no urgency, no nocturia. Musculoskeletal:No arthralgias, no back pain, no gait disturbance or myalgias. Neurological: No dizziness, no headaches, no numbness, no seizures, no syncope, no weakness, no tremors. Hematologic: No lymphadenopathy, no easy bruising. Psychiatric: No confusion, no hallucinations, no sleep disturbance.    Physical Exam: Filed Vitals:   04/14/14 1513  BP: 114/74  Pulse: 58   the  general appearance reveals a well-developed well-nourished pleasant gentleman in no distress.The head and neck exam reveals pupils equal and reactive.  Extraocular movements are full.  There is no scleral icterus.  The mouth and pharynx are normal.  The neck is supple.  The carotids reveal no bruits.  The jugular venous pressure is normal.  The  thyroid is not enlarged.  There is no lymphadenopathy.  The chest is clear to percussion and auscultation.  There are no rales or rhonchi.  Expansion of the chest is symmetrical.  The precordium is quiet.  The first heart sound is normal.  The second heart sound is physiologically split.  There is no murmur gallop rub or click.  There is no abnormal lift or heave.  The abdomen is soft and nontender.  The bowel sounds are normal.  The liver and spleen are not enlarged.  There are no abdominal masses.  There are no abdominal bruits.  Extremities reveal good pedal pulses.  There is no phlebitis or edema.  There is no cyanosis or clubbing.  Strength is normal and symmetrical in all extremities.  There is no lateralizing weakness.  There are no sensory deficits.  The skin is warm and dry.  There is no rash.     Assessment / Plan: 1. chest pain with nonobstructive coronary disease 2. benign hypertensive heart disease without heart failure 3.  PVCs  4. Hypercholesterolemia 5. atherosclerosis of aorta 6. memory disorder  Plan: Continue same medication.  Recheck in 6 months for office visit and EKG

## 2014-04-14 NOTE — Assessment & Plan Note (Signed)
The patient has not had any myalgias from his statin therapy.

## 2014-04-14 NOTE — Assessment & Plan Note (Signed)
Blood pressure has been stable on current therapy.  No dizziness or syncope.

## 2014-04-14 NOTE — Patient Instructions (Signed)
Your physician recommends that you continue on your current medications as directed. Please refer to the Current Medication list given to you today.  Your physician wants you to follow-up in: 6 MONTH OV /EKG You will receive a reminder letter in the mail two months in advance. If you don't receive a letter, please call our office to schedule the follow-up appointment.  

## 2014-04-14 NOTE — Assessment & Plan Note (Signed)
The patient takes occasional sublingual nitroglycerin for chest pain.  The chest pain is random and not exertional.

## 2014-05-10 IMAGING — CR DG CHEST 2V
2 series · 2 of 2 positions shown · non-contrast
Comparison: 08/29/2011.

CLINICAL DATA: Left upper chest pain today. Ex-smoker. History of
hypertension.

EXAM:
CHEST  2 VIEW

[w chest pa]
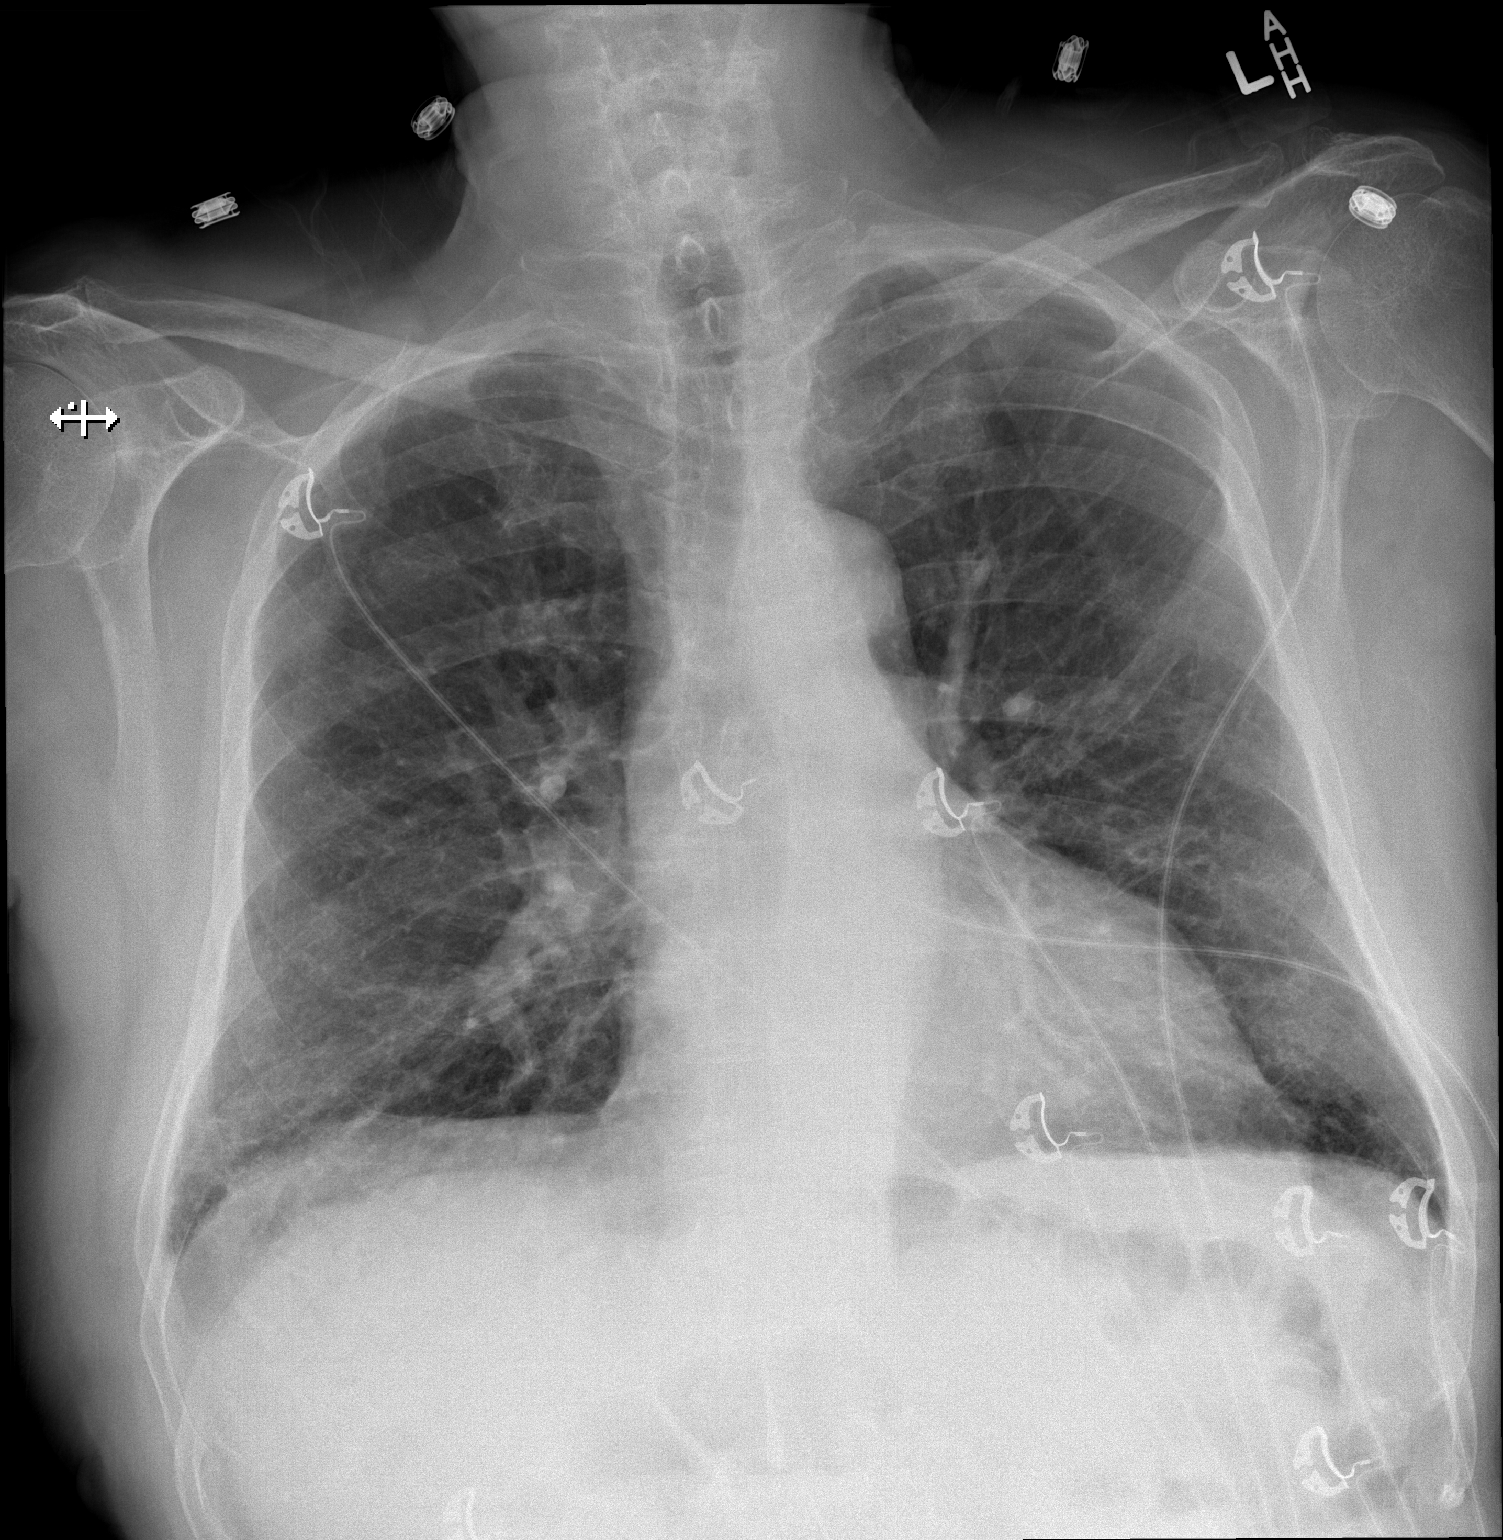

[w chest lat]
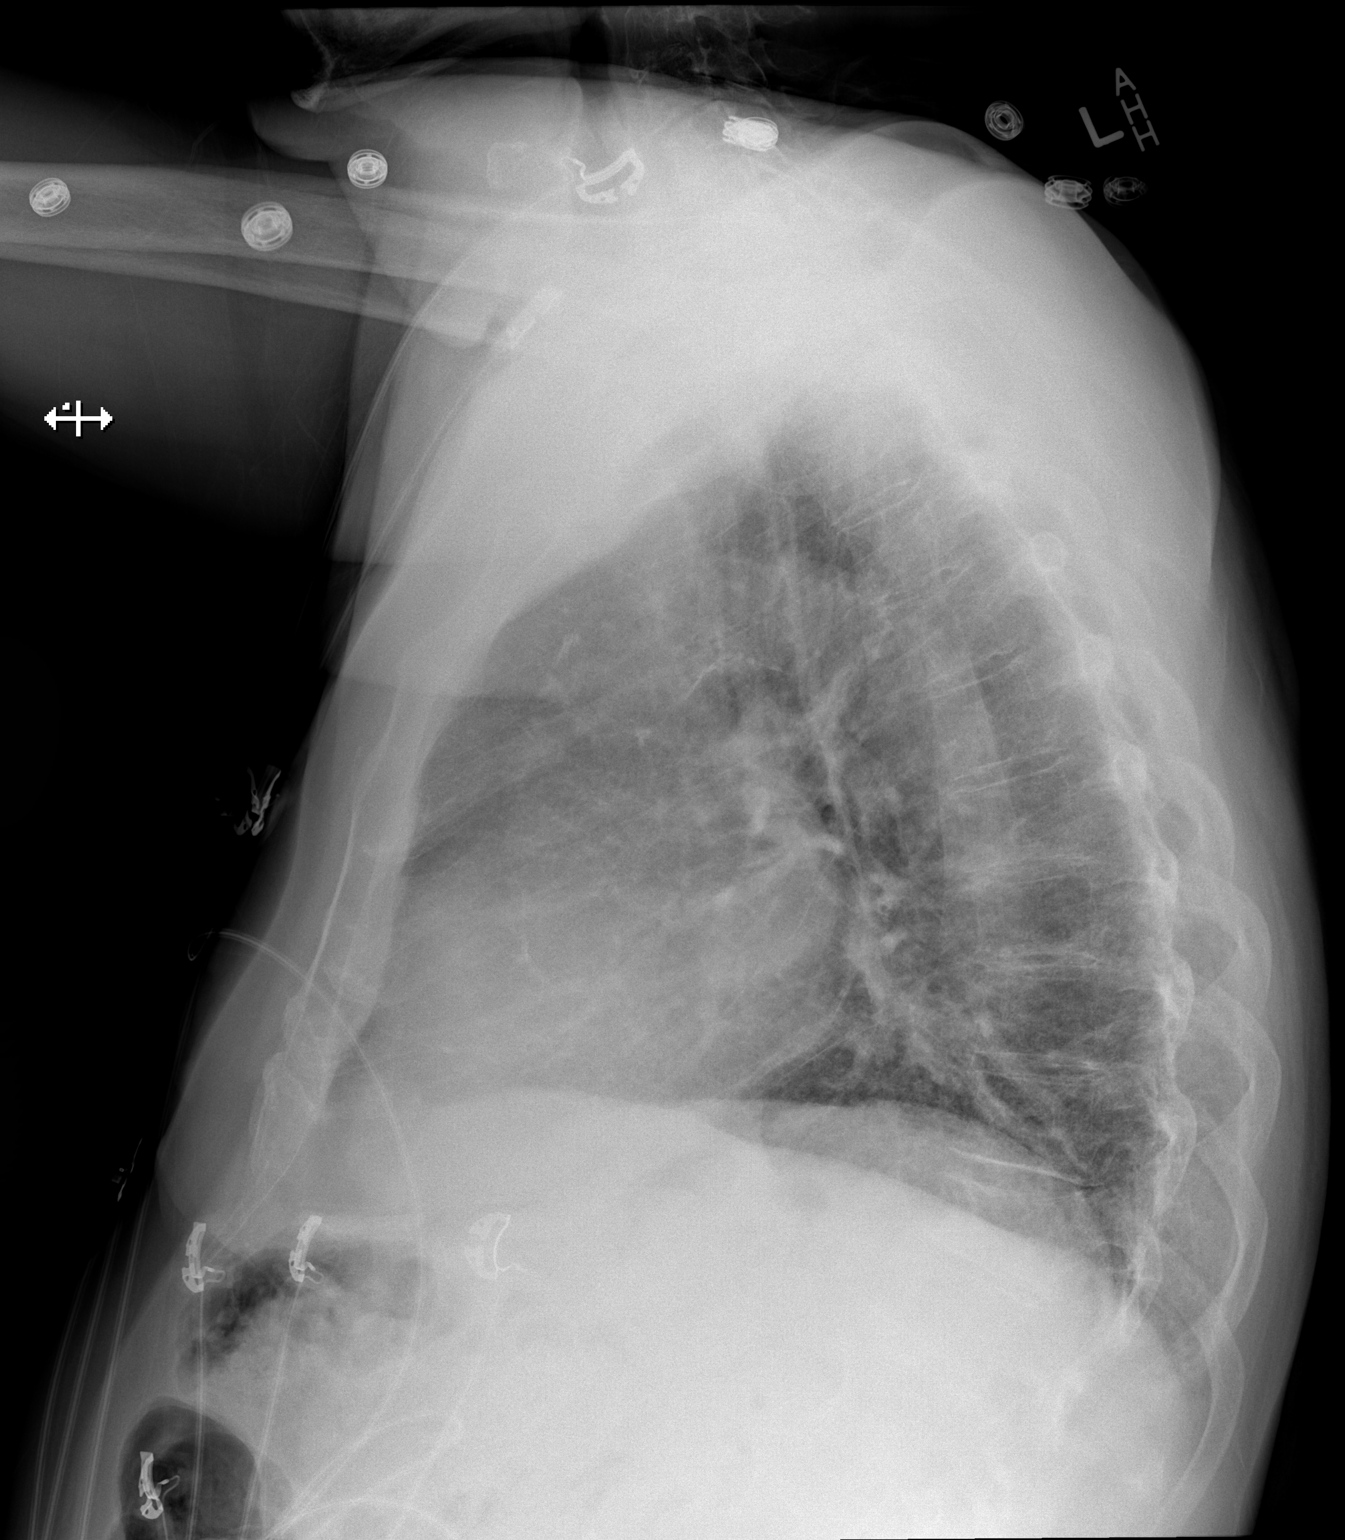

[2 of 2 positions shown; findings below may reference images not displayed]

FINDINGS: The heart size and mediastinal contours are stable. There is chronic
lung disease with interval increased interstitial prominence,
suspicious for superimposed edema or bronchitis. There is no
consolidation or significant pleural effusion. At lower thoracic
compression deformity appears unchanged.
IMPRESSION: Increased interstitial prominence compared with prior study,
suspicious for edema or bronchitis superimposed on chronic
obstructive lung disease.

## 2014-06-03 ENCOUNTER — Observation Stay (HOSPITAL_COMMUNITY)
Admission: EM | Admit: 2014-06-03 | Discharge: 2014-06-05 | Disposition: A | Discharge status: Home / Self Care | Payer: Medicare Other | Attending: Cardiology | Admitting: Cardiology

## 2014-06-03 ENCOUNTER — Encounter (HOSPITAL_COMMUNITY): Payer: Self-pay

## 2014-06-03 ENCOUNTER — Other Ambulatory Visit: Payer: Self-pay

## 2014-06-03 ENCOUNTER — Emergency Department (HOSPITAL_COMMUNITY): Payer: Medicare Other

## 2014-06-03 DIAGNOSIS — Z79899 Other long term (current) drug therapy: Secondary | ICD-10-CM | POA: Diagnosis not present

## 2014-06-03 DIAGNOSIS — Z7982 Long term (current) use of aspirin: Secondary | ICD-10-CM | POA: Insufficient documentation

## 2014-06-03 DIAGNOSIS — I10 Essential (primary) hypertension: Secondary | ICD-10-CM | POA: Diagnosis not present

## 2014-06-03 DIAGNOSIS — R079 Chest pain, unspecified: Principal | ICD-10-CM | POA: Diagnosis present

## 2014-06-03 DIAGNOSIS — I4729 Other ventricular tachycardia: Secondary | ICD-10-CM

## 2014-06-03 DIAGNOSIS — F0390 Unspecified dementia without behavioral disturbance: Secondary | ICD-10-CM | POA: Diagnosis present

## 2014-06-03 DIAGNOSIS — I499 Cardiac arrhythmia, unspecified: Secondary | ICD-10-CM | POA: Diagnosis not present

## 2014-06-03 DIAGNOSIS — I517 Cardiomegaly: Secondary | ICD-10-CM | POA: Diagnosis not present

## 2014-06-03 DIAGNOSIS — Z9889 Other specified postprocedural states: Secondary | ICD-10-CM | POA: Insufficient documentation

## 2014-06-03 DIAGNOSIS — E785 Hyperlipidemia, unspecified: Secondary | ICD-10-CM | POA: Insufficient documentation

## 2014-06-03 DIAGNOSIS — Z87891 Personal history of nicotine dependence: Secondary | ICD-10-CM | POA: Diagnosis not present

## 2014-06-03 DIAGNOSIS — I493 Ventricular premature depolarization: Secondary | ICD-10-CM | POA: Diagnosis present

## 2014-06-03 DIAGNOSIS — R0789 Other chest pain: Secondary | ICD-10-CM

## 2014-06-03 DIAGNOSIS — I208 Other forms of angina pectoris: Secondary | ICD-10-CM | POA: Insufficient documentation

## 2014-06-03 DIAGNOSIS — I519 Heart disease, unspecified: Secondary | ICD-10-CM | POA: Diagnosis not present

## 2014-06-03 DIAGNOSIS — I259 Chronic ischemic heart disease, unspecified: Secondary | ICD-10-CM | POA: Diagnosis not present

## 2014-06-03 DIAGNOSIS — K5900 Constipation, unspecified: Secondary | ICD-10-CM | POA: Insufficient documentation

## 2014-06-03 DIAGNOSIS — J439 Emphysema, unspecified: Secondary | ICD-10-CM | POA: Diagnosis not present

## 2014-06-03 DIAGNOSIS — I472 Ventricular tachycardia: Secondary | ICD-10-CM

## 2014-06-03 DIAGNOSIS — E782 Mixed hyperlipidemia: Secondary | ICD-10-CM | POA: Diagnosis present

## 2014-06-03 DIAGNOSIS — I2510 Atherosclerotic heart disease of native coronary artery without angina pectoris: Secondary | ICD-10-CM | POA: Diagnosis present

## 2014-06-03 DIAGNOSIS — I200 Unstable angina: Secondary | ICD-10-CM | POA: Diagnosis not present

## 2014-06-03 DIAGNOSIS — I119 Hypertensive heart disease without heart failure: Secondary | ICD-10-CM | POA: Diagnosis present

## 2014-06-03 HISTORY — DX: Unspecified fall, initial encounter: W19.XXXA

## 2014-06-03 HISTORY — DX: Other forms of angina pectoris: I20.89

## 2014-06-03 HISTORY — DX: Other forms of angina pectoris: I20.8

## 2014-06-03 LAB — BASIC METABOLIC PANEL
ANION GAP: 15 (ref 5–15)
BUN: 24 mg/dL — ABNORMAL HIGH (ref 6–23)
CHLORIDE: 99 meq/L (ref 96–112)
CO2: 24 mEq/L (ref 19–32)
Calcium: 9.7 mg/dL (ref 8.4–10.5)
Creatinine, Ser: 0.98 mg/dL (ref 0.50–1.35)
GFR calc Af Amer: 88 mL/min — ABNORMAL LOW (ref >90)
GFR, EST NON AFRICAN AMERICAN: 76 mL/min — AB (ref >90)
Glucose, Bld: 114 mg/dL — ABNORMAL HIGH (ref 70–99)
Potassium: 4.3 mEq/L (ref 3.7–5.3)
SODIUM: 138 meq/L (ref 137–147)

## 2014-06-03 LAB — CBC WITH DIFFERENTIAL/PLATELET
BASOS PCT: 1 % (ref 0–1)
Basophils Absolute: 0 10*3/uL (ref 0.0–0.1)
EOS ABS: 0.3 10*3/uL (ref 0.0–0.7)
Eosinophils Relative: 4 % (ref 0–5)
HCT: 40.6 % (ref 39.0–52.0)
Hemoglobin: 13.6 g/dL (ref 13.0–17.0)
Lymphocytes Relative: 22 % (ref 12–46)
Lymphs Abs: 1.3 10*3/uL (ref 0.7–4.0)
MCH: 31.6 pg (ref 26.0–34.0)
MCHC: 33.5 g/dL (ref 30.0–36.0)
MCV: 94.2 fL (ref 78.0–100.0)
Monocytes Absolute: 0.5 10*3/uL (ref 0.1–1.0)
Monocytes Relative: 8 % (ref 3–12)
NEUTROS ABS: 3.8 10*3/uL (ref 1.7–7.7)
NEUTROS PCT: 65 % (ref 43–77)
PLATELETS: 129 10*3/uL — AB (ref 150–400)
RBC: 4.31 MIL/uL (ref 4.22–5.81)
RDW: 13.1 % (ref 11.5–15.5)
WBC: 5.9 10*3/uL (ref 4.0–10.5)

## 2014-06-03 LAB — COMPREHENSIVE METABOLIC PANEL
ALBUMIN: 3.8 g/dL (ref 3.5–5.2)
ALK PHOS: 53 U/L (ref 39–117)
ALT: 29 U/L (ref 0–53)
AST: 31 U/L (ref 0–37)
Anion gap: 15 (ref 5–15)
BILIRUBIN TOTAL: 0.5 mg/dL (ref 0.3–1.2)
BUN: 24 mg/dL — ABNORMAL HIGH (ref 6–23)
CHLORIDE: 101 meq/L (ref 96–112)
CO2: 24 mEq/L (ref 19–32)
Calcium: 9.5 mg/dL (ref 8.4–10.5)
Creatinine, Ser: 0.91 mg/dL (ref 0.50–1.35)
GFR calc Af Amer: >90 mL/min (ref >90)
GFR calc non Af Amer: 78 mL/min — ABNORMAL LOW (ref >90)
GLUCOSE: 163 mg/dL — AB (ref 70–99)
POTASSIUM: 3.8 meq/L (ref 3.7–5.3)
Sodium: 140 mEq/L (ref 137–147)
Total Protein: 6.8 g/dL (ref 6.0–8.3)

## 2014-06-03 LAB — TSH: TSH: 2.05 u[IU]/mL (ref 0.350–4.500)

## 2014-06-03 LAB — PROTIME-INR
INR: 1.07 (ref 0.00–1.49)
Prothrombin Time: 14 seconds (ref 11.6–15.2)

## 2014-06-03 LAB — PRO B NATRIURETIC PEPTIDE: PRO B NATRI PEPTIDE: 171.7 pg/mL (ref 0–450)

## 2014-06-03 LAB — APTT: APTT: 32 s (ref 24–37)

## 2014-06-03 LAB — TROPONIN I
Troponin I: <0.3 ng/mL (ref <0.30)
Troponin I: <0.3 ng/mL (ref <0.30)

## 2014-06-03 LAB — MAGNESIUM: Magnesium: 2.4 mg/dL (ref 1.5–2.5)

## 2014-06-03 MED ORDER — NITROGLYCERIN 0.4 MG SL SUBL: 0.4000 mg | SUBLINGUAL_TABLET | SUBLINGUAL | Status: DC | PRN

## 2014-06-03 MED ORDER — ASPIRIN 300 MG RE SUPP: 300.0000 mg | RECTAL | Status: AC

## 2014-06-03 MED ORDER — HEPARIN SODIUM (PORCINE) 5000 UNIT/ML IJ SOLN
5000.0000 [IU] | Freq: Three times a day (TID) | INTRAMUSCULAR | Status: DC
  Administered 2014-06-03 – 2014-06-05 (×5): 5000 [IU] via SUBCUTANEOUS
  Filled 2014-06-03 (×3): qty 1

## 2014-06-03 MED ORDER — TRIAMTERENE-HCTZ 37.5-25 MG PO CAPS
1.0000 | ORAL_CAPSULE | Freq: Every day | ORAL | Status: DC
  Administered 2014-06-04 – 2014-06-05 (×2): 1 via ORAL
  Filled 2014-06-03 (×2): qty 1

## 2014-06-03 MED ORDER — DONEPEZIL HCL 10 MG PO TABS
10.0000 mg | ORAL_TABLET | Freq: Every day | ORAL | Status: DC
  Administered 2014-06-03 – 2014-06-04 (×2): 10 mg via ORAL
  Filled 2014-06-03: qty 1

## 2014-06-03 MED ORDER — ASPIRIN EC 81 MG PO TBEC
81.0000 mg | DELAYED_RELEASE_TABLET | Freq: Every day | ORAL | Status: DC
  Administered 2014-06-04 – 2014-06-05 (×2): 81 mg via ORAL
  Filled 2014-06-03 (×2): qty 1

## 2014-06-03 MED ORDER — DONEPEZIL HCL 5 MG PO TABS
5.0000 mg | ORAL_TABLET | Freq: Every day | ORAL | Status: DC
  Filled 2014-06-03: qty 1

## 2014-06-03 MED ORDER — ALPRAZOLAM 0.5 MG PO TABS
0.5000 mg | ORAL_TABLET | Freq: Every day | ORAL | Status: DC
  Administered 2014-06-03 – 2014-06-04 (×2): 0.5 mg via ORAL
  Filled 2014-06-03: qty 1

## 2014-06-03 MED ORDER — MEMANTINE HCL ER 28 MG PO CP24
28.0000 mg | ORAL_CAPSULE | Freq: Every morning | ORAL | Status: DC
  Administered 2014-06-04 – 2014-06-05 (×2): 28 mg via ORAL
  Filled 2014-06-03 (×2): qty 28

## 2014-06-03 MED ORDER — ASPIRIN 81 MG PO CHEW: 324.0000 mg | CHEWABLE_TABLET | ORAL | Status: AC

## 2014-06-03 MED ORDER — ONDANSETRON HCL 4 MG/2ML IJ SOLN: 4.0000 mg | Freq: Four times a day (QID) | INTRAMUSCULAR | Status: DC | PRN

## 2014-06-03 MED ORDER — ATORVASTATIN CALCIUM 80 MG PO TABS
80.0000 mg | ORAL_TABLET | Freq: Every day | ORAL | Status: DC
  Administered 2014-06-04: 80 mg via ORAL
  Filled 2014-06-03: qty 1

## 2014-06-03 MED ORDER — METOPROLOL SUCCINATE ER 50 MG PO TB24
50.0000 mg | ORAL_TABLET | Freq: Every day | ORAL | Status: DC
Start: 2014-06-04 — End: 2014-06-05
  Administered 2014-06-04 – 2014-06-05 (×2): 50 mg via ORAL
  Filled 2014-06-03 (×2): qty 1

## 2014-06-03 MED ORDER — ASPIRIN 81 MG PO CHEW
324.0000 mg | CHEWABLE_TABLET | Freq: Once | ORAL | Status: AC
  Administered 2014-06-03: 324 mg via ORAL

## 2014-06-03 MED ORDER — NITROGLYCERIN 2 % TD OINT
1.0000 [in_us] | TOPICAL_OINTMENT | Freq: Four times a day (QID) | TRANSDERMAL | Status: DC
  Administered 2014-06-03 (×2): 1 [in_us] via TOPICAL

## 2014-06-03 MED ORDER — ACETAMINOPHEN 325 MG PO TABS: 650.0000 mg | ORAL_TABLET | ORAL | Status: DC | PRN

## 2014-06-03 MED ORDER — ASPIRIN 81 MG PO TABS
81.0000 mg | ORAL_TABLET | Freq: Every day | ORAL | Status: DC
Start: 2014-06-04 — End: 2014-06-05
  Filled 2014-06-03 (×3): qty 1

## 2014-06-03 NOTE — ED Notes (Signed)
Per EMS, Patient has a history of Angina. Patient normally takes one Nitro and it gets relief. Patient had an episode today and the pain was not relieved with Nitro. Patient took a second Nitro and then made his way to his PCP. Patient's pain relieved down to a 2/10 from 10/10. Vitals per EMS: 128/84, 74, 18 RR, 1/10 98%

## 2014-06-03 NOTE — ED Notes (Signed)
Patient returned from [place]. Placed back on the monitor.

## 2014-06-03 NOTE — ED Notes (Signed)
Attempted Report x1.   

## 2014-06-03 NOTE — H&P (Signed)
Patient ID: [name] MRN: [id number], DOB/AGE: 78-Sep-1934   Admit date: 06/03/2014   Primary Physician: Reginia Naas, MD Primary Cardiologist: Dr. Mare Ferrari  Pt. Profile:  80 year old gentleman with known mild non-obstructive coronary artery disease from cardiac catheterization in 2003, admitted after experiencing substernal chest pain while shopping today.  Problem List  Past Medical History  Diagnosis Date  . Hypertension   . Hyperlipidemia   . LV dysfunction     EF normal per echo in 2005.   . IHD (ischemic heart disease)     remote cath in 2003 with mild nonobstructive disease. negative nuclear in 2008  . Constipation   . Bruising   . Forgetfulness   . Dementia     on Aricept and Namenda  . PVC's (premature ventricular contractions)   . Ventricular arrhythmia   . Angina at rest   . Fall     Past Surgical History  Procedure Laterality Date  . Cardiac catheterization  03/21/2002    MILD GLOBAL HYPOKINESIA. EF 50%  . US echocardiography  2005    Mild AS/AI, Mild MR with a normal EF  . Tonsillectomy    . Justiniano surgery      RIGHT [place]     Allergies  Allergies  Allergen Reactions  . Codeine Other (See Comments)    Anxiety and nervous feelings   . Crestor [Rosuvastatin Calcium] Other (See Comments)    Muscle pains in knees  . Lipitor [Atorvastatin Calcium] Other (See Comments)    Muscle pains in knees    HPI  This 80 year old gentleman is admitted after suffering substernal chest discomfort while shopping today.  The pain was not relieved with 2 sublingual nitroglycerin.  [place] [address] and was seen by Dr. Melinda Crutch.  EKG was nonacute, however because of the pain he was sent to the emergency room for further evaluation.  He was last seen in the office on 04/14/14.He has a history of ischemic heart disease. He had a cardiac catheterization in 2003 which showed minimal nonobstructive coronary disease. He had  a nuclear stress test in 2008 which showed no reversible ischemia. He does have significant hypercholesterolemia despite being on multiple medications. He is trying to adhere to a low-cholesterol diet. He has a history of normal systolic function with diastolic dysfunction by echo. He does have some early dementia  On 04/14/13 the patient had chest pain and went to the emergency room where he was evaluated. His EKG showed no acute changes and there were occasional PVCs. He had a CT angiogram of the chest which showed no evidence of pulmonary emboli. Atherosclerosis of the aorta was noted.  His wife feels that the frequency of his angina has increased over the past several weeks.  Normally a single nitroglycerin suffices to relieve his discomfort.  Home Medications  Prior to Admission medications   Medication Sig Start Date End Date Taking? Authorizing Provider  ALPRAZolam Duanne Moron) 0.5 MG tablet Take 0.5 mg by mouth at bedtime.  07/09/11  Yes Historical Provider, MD  aspirin 81 MG tablet Take 81 mg by mouth daily.    Yes Historical Provider, MD  atorvastatin (LIPITOR) 80 MG tablet Take 1 tablet (80 mg total) by mouth daily. 04/15/12  Yes Darlin Coco, MD  donepezil (ARICEPT) 10 MG tablet Take 10 mg by mouth at bedtime.   Yes Historical Provider, MD  donepezil (ARICEPT) 5 MG tablet Take 5 mg by mouth at bedtime.  Yes Historical Provider, MD  metoprolol (TOPROL-XL) 50 MG 24 hr tablet Take 50 mg by mouth daily.     Yes Historical Provider, MD  Misc Natural Products (OSTEO BI-FLEX ADV JOINT SHIELD PO) Take 1 tablet by mouth daily.    Yes Historical Provider, MD  Multiple Vitamin (MULTIVITAMIN) tablet Take 1 tablet by mouth daily.     Yes Historical Provider, MD  NAMENDA XR 28 MG CP24 Take 28 mg by mouth every morning.  04/13/14  Yes Historical Provider, MD  triamterene-hydrochlorothiazide (DYAZIDE) 37.5-25 MG per capsule Take 1 capsule by mouth daily.   Yes Historical Provider, MD    Family  History  Family History  Problem Relation Age of Onset  . Alzheimer's disease Mother   . Heart attack Father     Social History  History   Social History  . Marital Status: Married    Spouse Name: N/A    Number of Children: N/A  . Years of Education: N/A   Occupational History  . Not on file.   Social History Main Topics  . Smoking status: Former Smoker    Quit date: 12/31/1990  . Smokeless tobacco: Not on file  . Alcohol Use: No  . Drug Use: No  . Sexual Activity: No   Other Topics Concern  . Not on file   Social History Narrative     Review of Systems General:  No chills, fever, night sweats or weight changes.  Cardiovascular:  No chest pain, dyspnea on exertion, edema, orthopnea, palpitations, paroxysmal nocturnal dyspnea. Dermatological: No rash, lesions/masses Respiratory: No cough, dyspnea Urologic: No hematuria, dysuria Abdominal:   No nausea, vomiting, diarrhea, bright red blood per rectum, melena, or hematemesis Neurologic:  No visual changes, wkns, changes in mental status. All other systems reviewed and are otherwise negative except as noted above.  Physical Exam  Blood pressure 129/66, pulse 71, temperature 98.4 F (36.9 C), temperature source Oral, resp. rate 22, SpO2 93 %.  General: Pleasant, NAD.  Mildly demented Psych: Normal affect. Neuro: Alert. Moves all extremities spontaneously. HEENT: Normal  Neck: Supple without bruits or JVD. Lungs:  Resp regular and unlabored, CTA. Heart: RRR no s3, s4, or murmurs. Abdomen: Soft, non-tender, non-distended, BS + x 4.  Extremities: No clubbing, cyanosis or edema. DP/PT/Radials 2+ and equal bilaterally.  Labs  Troponin (Point of Care Test) No results for input(s): TROPIPOC in the last 72 hours.  Recent Labs  06/03/14 1452  TROPONINI <0.30   Lab Results  Component Value Date   WBC 5.9 06/03/2014   HGB 13.6 06/03/2014   HCT 40.6 06/03/2014   MCV 94.2 06/03/2014   PLT 129* 06/03/2014      Recent Labs Lab 06/03/14 1452  NA 138  K 4.3  CL 99  CO2 24  BUN 24*  CREATININE 0.98  CALCIUM 9.7  GLUCOSE 114*   Lab Results  Component Value Date   CHOL 133 10/01/2012   HDL 47.20 10/01/2012   LDLCALC 60 10/01/2012   TRIG 127.0 10/01/2012   Lab Results  Component Value Date   DDIMER 0.63* 04/14/2013     Radiology/Studies  Dg Chest 2 View  06/03/2014   CLINICAL DATA:  Chest pain for 1 day  EXAM: CHEST  2 VIEW  COMPARISON:  04/14/2013  FINDINGS: Diffusely prominent interstitial reticular opacities are noted. Emphysematous changes are reidentified with probable left apical bullous change. Curvilinear bilateral lower lobe scarring or atelectasis reidentified. No pleural effusion. Mild enlargement of the cardiac silhouette is  noted. The aorta is unfolded and ectatic. No new focal acute finding.  IMPRESSION: No new focal acute finding.   Electronically Signed   By: Conchita Paris M.D.   On: 06/03/2014 16:18    ECG  Normal sinus rhythm.  No ischemic changes.  No change from prior tracing.  ASSESSMENT AND PLAN  1. Chest pain, possible unstable angina. 2.  Past history of nonobstructive coronary artery disease at cardiac catheterization in 2003 3.  Atherosclerosis of aorta 4.  Hypercholesterolemia 5.  Dementia 6.  Past history of myalgias from atorvastatin but currently tolerating it without side effects.  Disposition: Admit for observation.  Serial cardiac enzymes.  Plan for Lexiscan Myoview stress test in a.m.    Signed, Darlin Coco, MD  06/03/2014, 5:58 PM

## 2014-06-03 NOTE — ED Notes (Signed)
Called Lab to acquire about lab results per MD request.

## 2014-06-03 NOTE — ED Provider Notes (Signed)
CSN: [id number]     Arrival date & time 06/03/14  1351 History   First MD Initiated Contact with Patient 06/03/14 1357     Chief Complaint  Patient presents with  . Chest Pain     (Consider location/radiation/quality/duration/timing/severity/associated sxs/prior Treatment) HPI Comments: Patient presents to the ER by ambulance. He is brought to the ER from his doctor's office. Patient was shopping when he had onset of left-sided chest pain and pressure. He reports a history of angina for which he takes nitroglycerin. Family reports that he occasionally needs to nitroglycerin, never needs more than 1 tablet to have complete resolution of his pain. Today while walking and shopping he had onset of the pain and it did not resolve after 2 sublingual nitroglycerin tablets. He went to his primary care doctor's office who did an EKG that did not show any acute changes. He was sent to the ER by EMS. Patient has "very slight" residual pain in the left chest. No radiation. No nausea or diaphoresis.  Patient is a 80 y.o. male presenting with chest pain.  Chest Pain   Past Medical History  Diagnosis Date  . Hypertension   . Hyperlipidemia   . LV dysfunction     EF normal per echo in 2005.   . IHD (ischemic heart disease)     remote cath in 2003 with mild nonobstructive disease. negative nuclear in 2008  . Constipation   . Bruising   . Forgetfulness   . Dementia     on Aricept and Namenda  . PVC's (premature ventricular contractions)   . Ventricular arrhythmia   . Angina at rest   . Fall    Past Surgical History  Procedure Laterality Date  . Cardiac catheterization  03/21/2002    MILD GLOBAL HYPOKINESIA. EF 50%  . US echocardiography  2005    Mild AS/AI, Mild MR with a normal EF  . Tonsillectomy    . Bostelman surgery      RIGHT [place]   Family History  Problem Relation Age of Onset  . Alzheimer's disease Mother   . Heart attack Father    History  Substance Use Topics  . Smoking status:  Former Smoker    Quit date: 12/31/1990  . Smokeless tobacco: Not on file  . Alcohol Use: No    Review of Systems  Cardiovascular: Positive for chest pain.  All other systems reviewed and are negative.     Allergies  Codeine; Crestor; and Lipitor  Home Medications   Prior to Admission medications   Medication Sig Start Date End Date Taking? Authorizing Provider  ALPRAZolam Duanne Moron) 0.5 MG tablet Take 0.25 mg by mouth at bedtime as needed for sleep.  07/09/11   Historical Provider, MD  aspirin 81 MG tablet Take 81 mg by mouth daily.     Historical Provider, MD  atorvastatin (LIPITOR) 80 MG tablet Take 1 tablet (80 mg total) by mouth daily. 04/15/12   Darlin Coco, MD  donepezil (ARICEPT) 5 MG tablet Take 5 mg by mouth at bedtime.      Historical Provider, MD  metoprolol (TOPROL-XL) 50 MG 24 hr tablet Take 50 mg by mouth daily.      Historical Provider, MD  Misc Natural Products (OSTEO BI-FLEX ADV JOINT SHIELD PO) Take by mouth.    Historical Provider, MD  Multiple Vitamin (MULTIVITAMIN) tablet Take 1 tablet by mouth daily.      Historical Provider, MD  NAMENDA XR 28 MG CP24  04/13/14  Historical Provider, MD   BP 96/45 mmHg  Pulse 65  Temp(Src) 98.4 F (36.9 C) (Oral)  Resp 16  SpO2 92% Physical Exam  Constitutional: He is oriented to person, place, and time. He appears well-developed and well-nourished. No distress.  HENT:  Head: Normocephalic and atraumatic.  Right Ear: Hearing normal.  Left Ear: Hearing normal.  Nose: Nose normal.  Mouth/Throat: Oropharynx is clear and moist and mucous membranes are normal.  Eyes: Conjunctivae and EOM are normal. Pupils are equal, round, and reactive to light.  Neck: Normal range of motion. Neck supple.  Cardiovascular: Regular rhythm, S1 normal and S2 normal.  Exam reveals no gallop and no friction rub.   No murmur heard. Pulmonary/Chest: Effort normal and breath sounds normal. No respiratory distress. He exhibits no tenderness.   Abdominal: Soft. Normal appearance and bowel sounds are normal. There is no hepatosplenomegaly. There is no tenderness. There is no rebound, no guarding, no tenderness at McBurney's point and negative Murphy's sign. No hernia.  Musculoskeletal: Normal range of motion.  Neurological: He is alert and oriented to person, place, and time. He has normal strength. No cranial nerve deficit or sensory deficit. Coordination normal. GCS eye subscore is 4. GCS verbal subscore is 5. GCS motor subscore is 6.  Skin: Skin is warm, dry and intact. No rash noted. No cyanosis.  Psychiatric: He has a normal mood and affect. His speech is normal and behavior is normal. Thought content normal.  Nursing note and vitals reviewed.   ED Course  Procedures (including critical care time) Labs Review Labs Reviewed  CBC WITH DIFFERENTIAL  BASIC METABOLIC PANEL  TROPONIN I  PRO B NATRIURETIC PEPTIDE    Imaging Review No results found.   EKG Interpretation None       Date: 06/03/2014  Rate: 65  Rhythm: normal sinus rhythm  QRS Axis: normal  Intervals: normal  ST/T Wave abnormalities: normal  Conduction Disutrbances:none  Narrative Interpretation:   Old EKG Reviewed: unchanged    MDM   Final diagnoses:  Chest pain    Presents to the ER for chest pain. Patient reports a history of angina that he treats with nitroglycerin occasionally. Patient had pain today that was not relieved after the second nitroglycerin which has not happened previously. He does not have ischemic changes on EKG. Patient improved here in the ER with Nitropaste. Blood pressure normal, all vital signs within normal limits. Troponin pending, will sign out to oncoming ER physician. Cardiology consultation for disposition.    Orpah Greek, MD 06/03/14 [phone]

## 2014-06-03 NOTE — ED Notes (Signed)
Attempted report x1. 

## 2014-06-04 ENCOUNTER — Observation Stay (HOSPITAL_COMMUNITY): Payer: Medicare Other

## 2014-06-04 ENCOUNTER — Other Ambulatory Visit: Payer: Self-pay

## 2014-06-04 DIAGNOSIS — I472 Ventricular tachycardia: Secondary | ICD-10-CM | POA: Diagnosis not present

## 2014-06-04 DIAGNOSIS — E785 Hyperlipidemia, unspecified: Secondary | ICD-10-CM | POA: Diagnosis not present

## 2014-06-04 DIAGNOSIS — I10 Essential (primary) hypertension: Secondary | ICD-10-CM | POA: Diagnosis not present

## 2014-06-04 DIAGNOSIS — R079 Chest pain, unspecified: Secondary | ICD-10-CM | POA: Diagnosis not present

## 2014-06-04 DIAGNOSIS — I2510 Atherosclerotic heart disease of native coronary artery without angina pectoris: Secondary | ICD-10-CM | POA: Diagnosis present

## 2014-06-04 DIAGNOSIS — I519 Heart disease, unspecified: Secondary | ICD-10-CM | POA: Diagnosis not present

## 2014-06-04 LAB — T4, FREE: Free T4: 1.07 ng/dL (ref 0.80–1.80)

## 2014-06-04 LAB — LIPID PANEL
CHOL/HDL RATIO: 5.2 ratio
CHOLESTEROL: 165 mg/dL (ref 0–200)
HDL: 32 mg/dL — AB (ref >39)
LDL Cholesterol: 84 mg/dL (ref 0–99)
Triglycerides: 246 mg/dL — ABNORMAL HIGH (ref <150)
VLDL: 49 mg/dL — ABNORMAL HIGH (ref 0–40)

## 2014-06-04 LAB — TROPONIN I
Troponin I: <0.3 ng/mL (ref <0.30)
Troponin I: <0.3 ng/mL (ref <0.30)

## 2014-06-04 MED ORDER — NITROGLYCERIN 0.4 MG SL SUBL: 0.4000 mg | SUBLINGUAL_TABLET | SUBLINGUAL | Status: DC | PRN

## 2014-06-04 MED ORDER — TECHNETIUM TC 99M SESTAMIBI GENERIC - CARDIOLITE
30.0000 | Freq: Once | INTRAVENOUS | Status: AC | PRN
  Administered 2014-06-04: 30 via INTRAVENOUS

## 2014-06-04 MED ORDER — REGADENOSON 0.4 MG/5ML IV SOLN
INTRAVENOUS | Status: AC
  Administered 2014-06-04: 0.4 mg
  Filled 2014-06-04: qty 5

## 2014-06-04 MED ORDER — ACETAMINOPHEN 325 MG PO TABS: 650.0000 mg | ORAL_TABLET | ORAL | Status: DC | PRN

## 2014-06-04 MED ORDER — TECHNETIUM TC 99M SESTAMIBI GENERIC - CARDIOLITE
10.0000 | Freq: Once | INTRAVENOUS | Status: AC | PRN
  Administered 2014-06-04: 10 via INTRAVENOUS

## 2014-06-04 NOTE — Progress Notes (Signed)
Called in by central re: 13 beat of V tach vs WQRS. Pt asymptomatic. Will update MD accordingly.

## 2014-06-04 NOTE — Progress Notes (Signed)
    Subjective:  No chest pain overnight  Objective:  Vital Signs in the last 24 hours: Temp:  [98 F (36.7 C)-98.4 F (36.9 C)] 98.4 F (36.9 C) (12/05 0500) Pulse Rate:  [41-81] 59 (12/05 0500) Resp:  [12-22] 18 (12/05 0500) BP: (96-148)/(45-86) 123/71 mmHg (12/05 1145) SpO2:  [90 %-100 %] 94 % (12/05 0500) Weight:  [177 lb 1.6 oz (80.332 kg)] 177 lb 1.6 oz (80.332 kg) (12/04 2045)  Intake/Output from previous day: No intake or output data in the 24 hours ending 06/04/14 1146  Physical Exam: General appearance: alert, cooperative and no distress Lungs: clear to auscultation bilaterally Heart: regular rate and rhythm   Rate: 60  Rhythm: normal sinus rhythm  Lab Results:  Recent Labs  06/03/14 1452  WBC 5.9  HGB 13.6  PLT 129*    Recent Labs  06/03/14 1452 06/03/14 2052  NA 138 140  K 4.3 3.8  CL 99 101  CO2 24 24  GLUCOSE 114* 163*  BUN 24* 24*  CREATININE 0.98 0.91    Recent Labs  06/04/14 0231 06/04/14 0805  TROPONINI <0.30 <0.30    Recent Labs  06/03/14 2052  INR 1.07    Imaging: Imaging results have been reviewed  Cardiac Studies:  Assessment/Plan:   Principal Problem:   Chest pain with moderate risk of acute coronary syndrome Active Problems:   CAD (coronary artery disease)- mild 2003   Hypercholesterolemia with hypertriglyceridemia   Benign hypertensive heart disease without heart failure   Dementia   PVC (premature ventricular contraction)   PLAN: [employer] today  Vergia Alberts [phone] 06/04/2014, 11:46 AM   Patient seen and examined.  The patient feels much better today and has no recurrent chest pain.  History reviewed and agree with above.  He has PVCs noted on monitoring.  At 8:27.  There was a 13 beat run of ventricular tachycardia.  The patient really wants to go home.  The Myoview is currently pending.  If the Myoview results are normal without ischemia.  He could go home on his beta blocker  therapy.  If there is abnormality, he would need to stay and I went over these recommendations with the family.  with the family.  Kerry Hough. MD [facility] 06/04/2014 4:55PM

## 2014-06-04 NOTE — Discharge Instructions (Signed)

## 2014-06-04 NOTE — Progress Notes (Signed)
UR completed 

## 2014-06-05 DIAGNOSIS — I472 Ventricular tachycardia: Secondary | ICD-10-CM

## 2014-06-05 DIAGNOSIS — R079 Chest pain, unspecified: Secondary | ICD-10-CM | POA: Diagnosis not present

## 2014-06-05 DIAGNOSIS — I4729 Other ventricular tachycardia: Secondary | ICD-10-CM

## 2014-06-05 NOTE — Progress Notes (Signed)
Subjective: No CP that he can remember.  He did complain of some very mild discomfort in his left chest that is atypical.  He had 2 runs of nonsustained V. tach overnight.  Objective: Vital signs in last 24 hours: Temp:  [97.4 F (36.3 C)-98.4 F (36.9 C)] 98.4 F (36.9 C) (12/06 0531) Pulse Rate:  [57-75] 57 (12/06 0531) Resp:  [17-18] 18 (12/06 0531) BP: (112-148)/(55-80) 112/55 mmHg (12/06 0531) SpO2:  [92 %-97 %] 97 % (12/06 0531) Weight:  [175 lb (79.379 kg)] 175 lb (79.379 kg) (12/06 0531)    Intake/Output from previous day: 12/05 0701 - 12/06 0700 In: 600 [P.O.:600] Out: -  Intake/Output this shift:    Medications Current Facility-Administered Medications  Medication Dose Route Frequency Provider Last Rate Last Dose  . acetaminophen (TYLENOL) tablet 650 mg  650 mg Oral Q4H PRN Darlin Coco, MD      . ALPRAZolam Duanne Moron) tablet 0.5 mg  0.5 mg Oral QHS Darlin Coco, MD   0.5 mg at 06/04/14 2045  . aspirin EC tablet 81 mg  81 mg Oral Daily Darlin Coco, MD   81 mg at 06/04/14 1341  . aspirin tablet 81 mg  81 mg Oral Daily Darlin Coco, MD   81 mg at 06/04/14 1344  . atorvastatin (LIPITOR) tablet 80 mg  80 mg Oral Daily Darlin Coco, MD   80 mg at 06/04/14 1341  . donepezil (ARICEPT) tablet 10 mg  10 mg Oral QHS Darlin Coco, MD   10 mg at 06/04/14 2048  . donepezil (ARICEPT) tablet 5 mg  5 mg Oral QHS Darlin Coco, MD   5 mg at 06/03/14 2200  . heparin injection 5,000 Units  5,000 Units Subcutaneous 3 times per day Darlin Coco, MD   5,000 Units at 06/05/14 0555  . Memantine HCl ER CP24 28 mg  28 mg Oral q morning - 10a Darlin Coco, MD   28 mg at 06/04/14 1341  . metoprolol succinate (TOPROL-XL) 24 hr tablet 50 mg  50 mg Oral Daily Darlin Coco, MD   50 mg at 06/04/14 1341  . nitroGLYCERIN (NITROSTAT) SL tablet 0.4 mg  0.4 mg Sublingual Q5 Min x 3 PRN Darlin Coco, MD      . ondansetron [facility]) injection 4 mg  4 mg  Intravenous Q6H PRN Darlin Coco, MD      . triamterene-hydrochlorothiazide (DYAZIDE) 37.5-25 MG per capsule 1 capsule  1 capsule Oral Daily Darlin Coco, MD   1 capsule at 06/04/14 1340    PE: General appearance: alert, cooperative and no distress Lungs: clear to auscultation bilaterally Heart: regular rate and rhythm Extremities: No LEE Pulses: 2+ and symmetric Skin: Warm and dry Neurologic: Grossly normal  Lab Results:   Recent Labs  06/03/14 1452  WBC 5.9  HGB 13.6  HCT 40.6  PLT 129*   BMET  Recent Labs  06/03/14 1452 06/03/14 2052  NA 138 140  K 4.3 3.8  CL 99 101  CO2 24 24  GLUCOSE 114* 163*  BUN 24* 24*  CREATININE 0.98 0.91  CALCIUM 9.7 9.5   PT/INR  Recent Labs  06/03/14 2052  LABPROT 14.0  INR 1.07   Cholesterol  Recent Labs  06/04/14 0231  CHOL 165   Lipid Panel     Component Value Date/Time   CHOL 165 06/04/2014 0231   TRIG 246* 06/04/2014 0231   HDL 32* 06/04/2014 0231   CHOLHDL 5.2 06/04/2014 0231   VLDL 49* 06/04/2014 0231  LDLCALC 84 06/04/2014 0231   LDLDIRECT 214.6 04/13/2012 0951   Cardiac Panel (last 3 results)  Recent Labs  06/03/14 2052 06/04/14 0231 06/04/14 0805  TROPONINI <0.30 <0.30 <0.30    Assessment/Plan 80 year old gentleman with known mild non-obstructive coronary artery disease from cardiac catheterization in 2003, admitted after experiencing substernal chest pain while shopping today. He had a nuclear stress test in 2013 which showed no reversible ischemia with EF of 42%. He does have significant hypercholesterolemia despite being on multiple medications.  1. Chest pain, possible unstable angina. Feels better.  Ruled out for MI. Lexiscan was nonischemic and low risk.  EF 37%.  EF calculate during 2013 stress was 42%.  No signs/symp of CHF.  Last echo 09/12/11 EF 45-50%  ASA, toprol 50.  2. Past history of nonobstructive coronary artery disease at cardiac catheterization in 2003 3.  Atherosclerosis of aorta 4. Hypercholesterolemia TG 246. On lipitor 80 5. Dementia 6. Past history of myalgias from atorvastatin but currently tolerating it without side effects. 7. NSVT 13 beat run yesterday but nothing more than 3 beats since. Also having runs of bigeminy, PACs. BP stable. Ekg: NSR QTc 4101ms.  Continue Toprol 50mg .  I dont think BP will support more plus he does get bradycardic.  DC home and follow up with Dr. Mare Ferrari  He has PVCs frequently still and did have some nonsustained runs of V. tach.  Since his ejection fraction is about 35%.  Continue beta blocker therapy.  He needs to see Dr. Mare Ferrari in the next week.  Kerry Hough MD [facility]

## 2014-06-05 NOTE — Progress Notes (Signed)
Utilization Review Completed.   Natallia Stellmach, RN, BSN Nurse Case Manager  

## 2014-06-05 NOTE — Discharge Summary (Signed)
Physician Discharge Summary     Cardiologist:  Mare Ferrari  Patient ID: JANET DECESARE MRN: [id number] DOB/AGE: May 01, 1933 80 y.o.  Admit date: 06/03/2014 Discharge date: 06/05/2014  Admission Diagnoses:  Chest Pain  Discharge Diagnoses:  Principal Problem:   Chest pain with moderate risk of acute coronary syndrome Active Problems:   Hypercholesterolemia with hypertriglyceridemia   Benign hypertensive heart disease without heart failure   Dementia   PVC (premature ventricular contraction)   CAD (coronary artery disease)- mild 2003   NSVT (nonsustained ventricular tachycardia)     Discharged Condition: stable  Hospital Course:   This 80 year old gentleman is admitted after suffering substernal chest discomfort while shopping today. The pain was not relieved with 2 sublingual nitroglycerin. He [place] [address] and was seen by Dr. Melinda Crutch. EKG was nonacute, however because of the pain he was sent to the emergency room for further evaluation. He was last seen in the office on 04/14/14.He has a history of ischemic heart disease. He had a cardiac catheterization in 2003 which showed minimal nonobstructive coronary disease. He had a nuclear stress test in 2013 which showed no reversible ischemia and an EF of 42%. He does have significant hypercholesterolemia despite being on multiple medications. He is trying to adhere to a low-cholesterol diet. He has a history of normal systolic function with diastolic dysfunction by echo. He does have some early dementia   On 04/14/13 the patient had chest pain and went to the emergency room where he was evaluated. His EKG showed no acute changes and there were occasional PVCs. He had a CT angiogram of the chest which showed no evidence of pulmonary emboli. Atherosclerosis of the aorta was noted. His wife felt that the frequency of his angina had increased over the past several weeks. Normally a single nitroglycerin  suffices to relieve his discomfort.  He was admitted for observation and ruled out for MI.  He had a Lexiscan cardiolite which was negative for ischemia with an EF of 37%.  Telemetry monitoring revealed infrequent runs of NSVT, with a 13 beat run being the longest, frequent PVCs, PACs.  He is on toprol 50mg  daily and BP will not support increasing the dose. The patient was seen by Dr. Wynonia Lawman who felt he was stable for DC home.  Follow up with Dr. Mare Ferrari    Consults: None  Significant Diagnostic Studies:  MYOCARDIAL IMAGING WITH SPECT (REST AND PHARMACOLOGIC-STRESS)  GATED LEFT VENTRICULAR WALL MOTION STUDY  LEFT VENTRICULAR EJECTION FRACTION  TECHNIQUE: Standard myocardial SPECT imaging was performed after resting intravenous injection of 10 mCi Tc-79m sestamibi. Subsequently, intravenous infusion of Lexiscan was performed under the supervision of the Cardiology staff. At peak effect of the drug, 30 mCi Tc-69m sestamibi was injected intravenously and standard myocardial SPECT imaging was performed. Quantitative gated imaging was also performed to evaluate left ventricular wall motion, and estimate left ventricular ejection fraction.  COMPARISON: None.  FINDINGS: Perfusion: No decreased activity in the left ventricle on stress imaging to suggest reversible ischemia or infarction.  Wall Motion: Normal left ventricular wall motion. No left ventricular dilation.  Left Ventricular Ejection Fraction: 37 % End diastolic volume 093 ml End systolic volume 89 ml  IMPRESSION: 1. No reversible ischemia or infarction. 2. Normal left ventricular wall motion. 3. Left ventricular ejection fraction 37% 4. Low-risk stress test findings*.  Lipid Panel     Component Value Date/Time   CHOL 165 06/04/2014 0231   TRIG 246* 06/04/2014  0231   HDL 32* 06/04/2014 0231   CHOLHDL 5.2 06/04/2014 0231   VLDL 49* 06/04/2014 0231   LDLCALC 84 06/04/2014 0231   LDLDIRECT 214.6 04/13/2012  0951     Treatments: See Above  Discharge Exam: Blood pressure 112/55, pulse 57, temperature 98.4 F (36.9 C), temperature source Oral, resp. rate 18, height 5\' 7"  (1.702 m), weight 175 lb (79.379 kg), SpO2 97 %.   Disposition: 01-Home or Self Care      Discharge Instructions    Diet - low sodium heart healthy    Complete by:  As directed             Medication List    TAKE these medications        acetaminophen 325 MG tablet  Commonly known as:  TYLENOL  Take 2 tablets (650 mg total) by mouth every 4 (four) hours as needed for headache or mild pain.     ALPRAZolam 0.5 MG tablet  Commonly known as:  XANAX  Take 0.5 mg by mouth at bedtime.     aspirin 81 MG tablet  Take 81 mg by mouth daily.     atorvastatin 80 MG tablet  Commonly known as:  LIPITOR  Take 1 tablet (80 mg total) by mouth daily.     donepezil 10 MG tablet  Commonly known as:  ARICEPT  Take 10 mg by mouth at bedtime.     metoprolol succinate 50 MG 24 hr tablet  Commonly known as:  TOPROL-XL  Take 50 mg by mouth daily.     multivitamin tablet  Take 1 tablet by mouth daily.     NAMENDA XR 28 MG Cp24  Generic drug:  Memantine HCl ER  Take 28 mg by mouth every morning.     nitroGLYCERIN 0.4 MG SL tablet  Commonly known as:  NITROSTAT  Place 1 tablet (0.4 mg total) under the tongue every 5 (five) minutes x 3 doses as needed for chest pain (if needed for severe chest pain or pressure).     OSTEO BI-FLEX ADV JOINT SHIELD PO  Take 1 tablet by mouth daily.     triamterene-hydrochlorothiazide 37.5-25 MG per capsule  Commonly known as:  DYAZIDE  Take 1 capsule by mouth daily.       Follow-up Information    Follow up with Darlin Coco, MD.   Specialty:  Cardiology   Why:  office will call you   Contact information:   [address]  [place] [address] [phone]      Greater than 30 minutes was spent completing the patient's discharge.   SignedTarri Fuller,  [place] 06/05/2014, 8:31 AM  Patient seen and examined and is stable for discharge.  He will follow-up with Dr. Mare Ferrari in one week.  Kerry Hough MD [facility]

## 2014-06-08 ENCOUNTER — Telehealth: Payer: Self-pay | Admitting: Cardiology

## 2014-06-08 NOTE — Telephone Encounter (Signed)
Left message to call back   [place] per  Dr. Mare Ferrari for patient to follow up with PA/NP

## 2014-06-08 NOTE — Telephone Encounter (Signed)
New Message  Per Pt's son- Pt was in the hospital from 12/4-12/6; was seen by Dr. Mare Ferrari and was told to be seen at his earliest opening.  Pt is scheduled for 1/29 appt @ 9:15. Pt wants earlier appt. Please call back and discuss.

## 2014-06-10 NOTE — Telephone Encounter (Signed)
Patient has appointment with Tera Helper NP 12/15. Left message with son if needs anything else to call back

## 2014-06-13 DIAGNOSIS — F0390 Unspecified dementia without behavioral disturbance: Secondary | ICD-10-CM | POA: Diagnosis not present

## 2014-06-13 DIAGNOSIS — I200 Unstable angina: Secondary | ICD-10-CM | POA: Diagnosis not present

## 2014-06-13 DIAGNOSIS — I10 Essential (primary) hypertension: Secondary | ICD-10-CM | POA: Diagnosis not present

## 2014-06-14 ENCOUNTER — Ambulatory Visit (INDEPENDENT_AMBULATORY_CARE_PROVIDER_SITE_OTHER): Payer: Medicare Other | Admitting: Nurse Practitioner

## 2014-06-14 VITALS — BP 124/60 | HR 76 | Ht 67.0 in | Wt 177.2 lb

## 2014-06-14 DIAGNOSIS — I259 Chronic ischemic heart disease, unspecified: Secondary | ICD-10-CM

## 2014-06-14 DIAGNOSIS — I493 Ventricular premature depolarization: Secondary | ICD-10-CM

## 2014-06-14 DIAGNOSIS — F0390 Unspecified dementia without behavioral disturbance: Secondary | ICD-10-CM

## 2014-06-14 DIAGNOSIS — I119 Hypertensive heart disease without heart failure: Secondary | ICD-10-CM | POA: Diagnosis not present

## 2014-06-14 MED ORDER — ISOSORBIDE MONONITRATE ER 30 MG PO TB24: 15.0000 mg | ORAL_TABLET | Freq: Every day | ORAL | Status: DC

## 2014-06-14 NOTE — Patient Instructions (Addendum)
Stay on your current medicines but I adding Imdur 30 mg to take just a half a pill each day.  See Dr. Mare Ferrari back as planned  Hold off on any trips for now  Call the [place] office at [phone] if you have any questions, problems or concerns.

## 2014-06-14 NOTE — Progress Notes (Signed)
[name] Date of Birth: [birth year] Medical Record #[id number]  History of Present Illness: [name] is seen back today for a post hospital visit. Seen for Dr. Mare Ferrari. He is a 80 year old male with known ischemic heart disease. He had a cardiac catheterization in 2003 which showed minimal nonobstructive coronary disease. He had a nuclear stress test in 2008 which showed no reversible ischemia. He does have significant hypercholesterolemia despite being on multiple medications.  His lipids are checked by his PCP Dr. Carol Ada.  He has a history of normal systolic function with diastolic dysfunction by echo. He does have some early dementia - no longer takes Aricept. He is on Namenda XR instead.  Last seen here in the office in October and was felt to be doing well.  Most recently admitted with chest pain. He ruled out for MI. He had a Lexiscan cardiolite which was negative for ischemia with an EF of 37%. Telemetry monitoring revealed infrequent runs of NSVT, with a 13 beat run being the longest, frequent PVCs, PACs. He is on toprol 50mg  daily and BP would not support increasing the dose.  Comes back today. Here with his wife and daughter. History is a little conflicting. First he says he is feeling better. Then says he has pain. Wife says he complains of a lot of chest heaviness. Little unsteady on his feet. No falls. They have been trying to limit his activities somewhat. Using NTG but actually less than what he was taking prior to this most recent admission. Daughter is managing his medicines.   Current Outpatient Prescriptions  Medication Sig Dispense Refill  . acetaminophen (TYLENOL) 325 MG tablet Take 2 tablets (650 mg total) by mouth every 4 (four) hours as needed for headache or mild pain.    Marland Kitchen ALPRAZolam (XANAX) 0.5 MG tablet Take 0.5 mg by mouth at bedtime.     Marland Kitchen aspirin 81 MG tablet Take 81 mg by mouth daily.     Marland Kitchen atorvastatin (LIPITOR) 80 MG tablet Take 1 tablet (80 mg  total) by mouth daily. 90 tablet 3  . donepezil (ARICEPT) 10 MG tablet Take 10 mg by mouth at bedtime.    . metoprolol (TOPROL-XL) 50 MG 24 hr tablet Take 50 mg by mouth daily.      . Misc Natural Products (OSTEO BI-FLEX ADV JOINT SHIELD PO) Take 1 tablet by mouth daily.     . Multiple Vitamin (MULTIVITAMIN) tablet Take 1 tablet by mouth daily.      Marland Kitchen NAMENDA XR 28 MG CP24 Take 28 mg by mouth every morning.     . nitroGLYCERIN (NITROSTAT) 0.4 MG SL tablet Place 1 tablet (0.4 mg total) under the tongue every 5 (five) minutes x 3 doses as needed for chest pain (if needed for severe chest pain or pressure). 25 tablet 2  . triamterene-hydrochlorothiazide (DYAZIDE) 37.5-25 MG per capsule Take 1 capsule by mouth daily.     No current facility-administered medications for this visit.    Allergies  Allergen Reactions  . Codeine Other (See Comments)    Anxiety and nervous feelings   . Crestor [Rosuvastatin Calcium] Other (See Comments)    Muscle pains in knees    Past Medical History  Diagnosis Date  . Hypertension   . Hyperlipidemia   . LV dysfunction     EF normal per echo in 2005.   . IHD (ischemic heart disease)     remote cath in 2003 with mild nonobstructive disease. negative  nuclear in 2008  . Constipation   . Bruising   . Forgetfulness   . Dementia     on Aricept and Namenda  . PVC's (premature ventricular contractions)   . Ventricular arrhythmia   . Angina at rest   . Fall     Past Surgical History  Procedure Laterality Date  . Cardiac catheterization  03/21/2002    MILD GLOBAL HYPOKINESIA. EF 50%  . US echocardiography  2005    Mild AS/AI, Mild MR with a normal EF  . Tonsillectomy    . [place] surgery      RIGHT [place]    History  Smoking status  . Former Smoker  . Quit date: 12/31/1990  Smokeless tobacco  . Not on file    History  Alcohol Use No    Family History  Problem Relation Age of Onset  . Alzheimer's disease Mother   . Heart attack Father      Review of Systems: The review of systems is per the HPI.  All other systems were reviewed and are negative.  Physical Exam: BP 124/60 mmHg  Pulse 76  Ht 5\' 7"  (1.702 m)  Wt 177 lb 4 oz (80.4 kg)  BMI 27.75 kg/m2 Patient is very pleasant and in no acute distress. Skin is warm and dry. Color is normal.  HEENT is unremarkable. Normocephalic/atraumatic. PERRL. Sclera are nonicteric. Neck is supple. No masses. No JVD. Lungs are clear. Cardiac exam shows a regular rate and rhythm. Abdomen is soft. Extremities are without edema. Gait and ROM are intact. No gross neurologic deficits noted.  Wt Readings from Last 3 Encounters:  06/14/14 177 lb 4 oz (80.4 kg)  06/05/14 175 lb (79.379 kg)  04/14/14 176 lb (79.833 kg)    LABORATORY DATA/PROCEDURES:  Lab Results  Component Value Date   WBC 5.9 06/03/2014   HGB 13.6 06/03/2014   HCT 40.6 06/03/2014   PLT 129* 06/03/2014   GLUCOSE 163* 06/03/2014   CHOL 165 06/04/2014   TRIG 246* 06/04/2014   HDL 32* 06/04/2014   LDLDIRECT 214.6 04/13/2012   LDLCALC 84 06/04/2014   ALT 29 06/03/2014   AST 31 06/03/2014   NA 140 06/03/2014   K 3.8 06/03/2014   CL 101 06/03/2014   CREATININE 0.91 06/03/2014   BUN 24* 06/03/2014   CO2 24 06/03/2014   TSH 2.050 06/03/2014   INR 1.07 06/03/2014    BNP (last 3 results)  Recent Labs  06/03/14 1452  PROBNP 171.7   Dg Chest 2 View  06/03/2014   CLINICAL DATA:  Chest pain for 1 day  EXAM: CHEST  2 VIEW  COMPARISON:  04/14/2013  FINDINGS: Diffusely prominent interstitial reticular opacities are noted. Emphysematous changes are reidentified with probable left apical bullous change. Curvilinear bilateral lower lobe scarring or atelectasis reidentified. No pleural effusion. Mild enlargement of the cardiac silhouette is noted. The aorta is unfolded and ectatic. No new focal acute finding.  IMPRESSION: No new focal acute finding.   Electronically Signed   By: Conchita Paris M.D.   On: 06/03/2014 16:18    Nm Myocar Multi W/spect W/wall Motion / Ef  06/04/2014   CLINICAL DATA:  Chest pain  EXAM: MYOCARDIAL IMAGING WITH SPECT (REST AND PHARMACOLOGIC-STRESS)  GATED LEFT VENTRICULAR WALL MOTION STUDY  LEFT VENTRICULAR EJECTION FRACTION  TECHNIQUE: Standard myocardial SPECT imaging was performed after resting intravenous injection of 10 mCi Tc-45m sestamibi. Subsequently, intravenous infusion of Lexiscan was performed under the supervision of the Cardiology staff. At  peak effect of the drug, 30 mCi Tc-53m sestamibi was injected intravenously and standard myocardial SPECT imaging was performed. Quantitative gated imaging was also performed to evaluate left ventricular wall motion, and estimate left ventricular ejection fraction.  COMPARISON:  None.  FINDINGS: Perfusion: No decreased activity in the left ventricle on stress imaging to suggest reversible ischemia or infarction.  Wall Motion: Normal left ventricular wall motion. No left ventricular dilation.  Left Ventricular Ejection Fraction: 37 %  End diastolic volume 701 ml  End systolic volume 89 ml  IMPRESSION: 1. No reversible ischemia or infarction.  2. Normal left ventricular wall motion.  3. Left ventricular ejection fraction 37%  4. Low-risk stress test findings*.  *2012 Appropriate Use Criteria for Coronary Revascularization Focused Update: J Am Coll Cardiol. 7793;90(3):009-233. http://content.airportbarriers.com.aspx?articleid=1201161   Electronically Signed   By: Julian Hy M.D.   On: 06/04/2014 17:23     Assessment / Plan: 1. Chest pain - has known CAD - recent admission for chest pain and has had negative Myoview - would continue with medical management - I am adding low dose Imdur at 15 mg a day. See back as planned.   2. HTN -  BP ok - probably no room to increase his beta blocker at this time.   3. PVCs - EKG today just with NSR.  4. Hypercholesterolemia  5. Memory disorder  See back as planned next month. Add low dose Imdur  to his regimen.   Patient is agreeable to this plan and will call if any problems develop in the interim.   Burtis Junes, RN, [facility] [address] [place], [place]  [address] [phone]

## 2014-07-07 ENCOUNTER — Telehealth: Payer: Self-pay | Admitting: Cardiology

## 2014-07-07 MED ORDER — ISOSORBIDE MONONITRATE ER 30 MG PO TB24: 30.0000 mg | ORAL_TABLET | Freq: Every day | ORAL | Status: DC

## 2014-07-07 NOTE — Telephone Encounter (Signed)
New Msg       Pt c/o BP issue:  1. What are your last 5 BP readings? BP 193/117  2. Are you having any other symptoms (ex. Dizziness, headache, blurred vision, passed out)? Heavy chest, bad headache. 3. What is your medication issue? No

## 2014-07-07 NOTE — Telephone Encounter (Signed)
Spoke with wife when she called. Patients blood pressure down to 162/89 and had taken 2 NTG for chest pain. Per wife chest pain/pressure was at a 4. Did ask wife what patient pain level started at but per wife he could not remember. Patient does suffer from memory issues. Recent hospitalization ruled out for MI and Myoview on 06/04/14 showed low risk study. Discussed with  Dr. Mare Ferrari and recommended patient take another NTG, another 1/2 of Imdur and 1/2 ov Metoprolol if heart rate about 60. Starting tomorrow patient to increase Imdur 30 mg to full tablet daily. Have patient rest and go to ED if no better. Call back with update. Did give information to wife. She called back around 3:00 pm with update. Patients blood pressure was 139/63 and patient feeling better. Advised wife if pain returns to go to ED.

## 2014-07-29 ENCOUNTER — Ambulatory Visit (INDEPENDENT_AMBULATORY_CARE_PROVIDER_SITE_OTHER): Payer: Medicare Other | Admitting: Cardiology

## 2014-07-29 ENCOUNTER — Encounter: Payer: Self-pay | Admitting: Cardiology

## 2014-07-29 VITALS — BP 108/62 | HR 86 | Ht 67.0 in | Wt 182.1 lb

## 2014-07-29 DIAGNOSIS — I119 Hypertensive heart disease without heart failure: Secondary | ICD-10-CM

## 2014-07-29 DIAGNOSIS — I259 Chronic ischemic heart disease, unspecified: Secondary | ICD-10-CM

## 2014-07-29 DIAGNOSIS — F0390 Unspecified dementia without behavioral disturbance: Secondary | ICD-10-CM

## 2014-07-29 NOTE — Progress Notes (Signed)
Cardiology Office Note   Date:  07/29/2014   ID:  [name], DOB [birth year], MRN [id number]  PCP:  Reginia Naas, MD  Cardiologist:   Darlin Coco, MD   No chief complaint on file.     History of Present Illness: [name] is a 81 y.o. male who presents for  Follow-up office visit.  This pleasant 81 year old gentleman is seen for a six-month followup office visit. He has a history of ischemic heart disease. He had a cardiac catheterization in 2003 which showed minimal nonobstructive coronary disease. He had a nuclear stress test in 2008 which showed no reversible ischemia. He does have significant hypercholesterolemia despite being on multiple medications. He is trying to adhere to a low-cholesterol diet. He has a history of normal systolic function with diastolic dysfunction by echo. He does have some early dementia  On 04/14/13 the patient had chest pain and went to the emergency room where he was evaluated. His EKG showed no acute changes and there were occasional PVCs. He had a CT angiogram of the chest which showed no evidence of pulmonary emboli. Atherosclerosis of the aorta was noted. He did not have to be admitted. Since then he has not had any his severe chest pain. He has occasional chest discomfort less than once a week. Usually the pain resolves without specific therapy. The patient's dementia is gradually worsening. He no longer takes Aricept. He is on Namenda XR instead. His lipids are checked by his PCP Dr. Carol Ada.  since last visit the patient denies any increase in his occasional angina pectoris for which he takes sublingual nitroglycerin. He has a history of mild left ventricular systolic dysfunction with ejection fraction of 42% in 2013 and 37% in 2015.  Past Medical History  Diagnosis Date  . Hypertension   . Hyperlipidemia   . LV dysfunction     EF normal per echo in 2005.   . IHD (ischemic heart disease)     remote cath in 2003  with mild nonobstructive disease. negative nuclear in 2008  . Constipation   . Bruising   . Forgetfulness   . Dementia     on Aricept and Namenda  . PVC's (premature ventricular contractions)   . Ventricular arrhythmia   . Angina at rest   . Fall     Past Surgical History  Procedure Laterality Date  . Cardiac catheterization  03/21/2002    MILD GLOBAL HYPOKINESIA. EF 50%  . US echocardiography  2005    Mild AS/AI, Mild MR with a normal EF  . Tonsillectomy    . [place] surgery      RIGHT [place]     Current Outpatient Prescriptions  Medication Sig Dispense Refill  . ALPRAZolam (XANAX) 0.5 MG tablet Take 0.5 mg by mouth at bedtime.     Marland Kitchen aspirin 81 MG tablet Take 81 mg by mouth daily.     Marland Kitchen atorvastatin (LIPITOR) 80 MG tablet Take 1 tablet (80 mg total) by mouth daily. 90 tablet 3  . donepezil (ARICEPT) 10 MG tablet Take 10 mg by mouth at bedtime.    . isosorbide mononitrate (IMDUR) 30 MG 24 hr tablet Take 1 tablet (30 mg total) by mouth daily. 90 tablet 3  . metoprolol (TOPROL-XL) 50 MG 24 hr tablet Take 50 mg by mouth daily.      . Misc Natural Products (OSTEO BI-FLEX ADV JOINT SHIELD PO) Take 1 tablet by mouth daily.     . Multiple Vitamin (  MULTIVITAMIN) tablet Take 1 tablet by mouth daily.      Marland Kitchen NAMENDA XR 28 MG CP24 Take 28 mg by mouth every morning.     . nitroGLYCERIN (NITROSTAT) 0.4 MG SL tablet Place 1 tablet (0.4 mg total) under the tongue every 5 (five) minutes x 3 doses as needed for chest pain (if needed for severe chest pain or pressure). 25 tablet 2  . triamterene-hydrochlorothiazide (DYAZIDE) 37.5-25 MG per capsule Take 1 capsule by mouth daily.    Marland Kitchen acetaminophen (TYLENOL) 325 MG tablet Take 2 tablets (650 mg total) by mouth every 4 (four) hours as needed for headache or mild pain.     No current facility-administered medications for this visit.    Allergies:   Codeine and Crestor    Social History:  The patient  reports that he quit smoking about 23 years ago.  He does not have any smokeless tobacco history on file. He reports that he does not drink alcohol or use illicit drugs.   Family History:   The patient's  family history includes Alzheimer's disease in his mother; Heart attack in his father.    ROS:  Please see the history of present illness.   Otherwise, review of systems are positive for none.   All other systems are reviewed and negative.    PHYSICAL EXAM: VS:  BP 108/62 mmHg  Pulse 86  Ht 5\' 7"  (1.702 m)  Wt 182 lb 1.9 oz (82.609 kg)  BMI 28.52 kg/m2  SpO2 96% , BMI Body mass index is 28.52 kg/(m^2). GEN: Well nourished, well developed, in no acute distress HEENT: normal Neck: no JVD, carotid bruits, or masses Cardiac: RRR; no murmurs, rubs, or gallops,no edema  Respiratory:  clear to auscultation bilaterally, normal work of breathing GI: soft, nontender, nondistended, + BS MS: no deformity or atrophy Skin: warm and dry, no rash Neuro:  Strength and sensation are intact Psych: euthymic mood, full affect. Mildly demented but very social.   EKG:  EKG is not ordered today.    Recent Labs: 06/03/2014: ALT 29; BUN 24*; Creatinine 0.91; Hemoglobin 13.6; Magnesium 2.4; Platelets 129*; Potassium 3.8; Pro B Natriuretic peptide (BNP) 171.7; Sodium 140; TSH 2.050    Lipid Panel    Component Value Date/Time   CHOL 165 06/04/2014 0231   TRIG 246* 06/04/2014 0231   HDL 32* 06/04/2014 0231   CHOLHDL 5.2 06/04/2014 0231   VLDL 49* 06/04/2014 0231   LDLCALC 84 06/04/2014 0231   LDLDIRECT 214.6 04/13/2012 0951      Wt Readings from Last 3 Encounters:  07/29/14 182 lb 1.9 oz (82.609 kg)  06/14/14 177 lb 4 oz (80.4 kg)  06/05/14 175 lb (79.379 kg)      Other studies Reviewed:    ASSESSMENT AND PLAN:  1. chest pain with nonobstructive coronary disease, stable on current therapy 2. benign hypertensive heart disease without heart failure. His weight is up 7 pounds since last visit. He was encouraged to try to lose  weight. 3. PVCs not noted on today's exam  4. Hypercholesterolemia followed by Dr. Dema Severin 5. atherosclerosis of aorta 6. memory disorder   Current medicines are reviewed at length with the patient today.  The patient does not have concerns regarding medicines.  The following changes have been made:  no change  Labs/ tests ordered today include:  No orders of the defined types were placed in this encounter.     Disposition:   FU with Dr. Mare Ferrari  in 6 months  OV EKG  Signed, Darlin Coco, MD  07/29/2014 1:22 PM    [facility] [place], [place], [place]  [address] Phone: [phone]; Fax: [phone]

## 2014-07-29 NOTE — Patient Instructions (Signed)
Your physician recommends that you continue on your current medications as directed. Please refer to the Current Medication list given to you today.  Your physician wants you to follow-up in: [place] will receive a reminder letter in the mail two months in advance. If you don't receive a letter, please call our office to schedule the follow-up appointment.   WORK HARDER ON DIET AND WEIGHT LOSS

## 2014-08-17 DIAGNOSIS — Z111 Encounter for screening for respiratory tuberculosis: Secondary | ICD-10-CM | POA: Diagnosis not present

## 2014-08-30 DIAGNOSIS — G459 Transient cerebral ischemic attack, unspecified: Secondary | ICD-10-CM | POA: Diagnosis not present

## 2014-08-30 DIAGNOSIS — M129 Arthropathy, unspecified: Secondary | ICD-10-CM | POA: Diagnosis not present

## 2014-08-30 DIAGNOSIS — E782 Mixed hyperlipidemia: Secondary | ICD-10-CM | POA: Diagnosis not present

## 2014-08-30 DIAGNOSIS — F0390 Unspecified dementia without behavioral disturbance: Secondary | ICD-10-CM | POA: Diagnosis not present

## 2014-08-30 DIAGNOSIS — Z022 Encounter for examination for admission to residential institution: Secondary | ICD-10-CM | POA: Diagnosis not present

## 2014-09-15 DIAGNOSIS — R262 Difficulty in walking, not elsewhere classified: Secondary | ICD-10-CM | POA: Diagnosis not present

## 2014-09-20 DIAGNOSIS — R262 Difficulty in walking, not elsewhere classified: Secondary | ICD-10-CM | POA: Diagnosis not present

## 2014-09-21 DIAGNOSIS — R262 Difficulty in walking, not elsewhere classified: Secondary | ICD-10-CM | POA: Diagnosis not present

## 2014-09-22 DIAGNOSIS — R262 Difficulty in walking, not elsewhere classified: Secondary | ICD-10-CM | POA: Diagnosis not present

## 2014-09-28 DIAGNOSIS — R262 Difficulty in walking, not elsewhere classified: Secondary | ICD-10-CM | POA: Diagnosis not present

## 2014-09-29 DIAGNOSIS — R262 Difficulty in walking, not elsewhere classified: Secondary | ICD-10-CM | POA: Diagnosis not present

## 2014-10-03 DIAGNOSIS — R262 Difficulty in walking, not elsewhere classified: Secondary | ICD-10-CM | POA: Diagnosis not present

## 2014-10-04 DIAGNOSIS — R262 Difficulty in walking, not elsewhere classified: Secondary | ICD-10-CM | POA: Diagnosis not present

## 2014-10-06 DIAGNOSIS — R262 Difficulty in walking, not elsewhere classified: Secondary | ICD-10-CM | POA: Diagnosis not present

## 2014-10-11 DIAGNOSIS — R262 Difficulty in walking, not elsewhere classified: Secondary | ICD-10-CM | POA: Diagnosis not present

## 2014-10-13 DIAGNOSIS — R262 Difficulty in walking, not elsewhere classified: Secondary | ICD-10-CM | POA: Diagnosis not present

## 2014-10-18 ENCOUNTER — Telehealth: Payer: Self-pay | Admitting: Cardiology

## 2014-10-18 DIAGNOSIS — R262 Difficulty in walking, not elsewhere classified: Secondary | ICD-10-CM | POA: Diagnosis not present

## 2014-10-18 NOTE — Telephone Encounter (Signed)
Discussed with  Dr. Mare Ferrari and will work in for [employer] and spoke with wife and offered appointment for today Wife stated that she didn't know if anyone could bring today and requested appointment for Thursday Scheduled appointment for Thursday and advised if worse go to ED, verbalized understanding

## 2014-10-18 NOTE — Telephone Encounter (Signed)
Pt c/o of Chest Pain: STAT if CP now or developed within 24 hours  1. Are you having CP right now? Yes  2. Are you experiencing any other symptoms (ex. SOB, nausea, vomiting, sweating)? No  3. How long have you been experiencing CP? Over the weekend  4. Is your CP continuous or coming and going? Coming and going  5. Have you taken Nitroglycerin? Yes  Pt's son calling stating that he is not with the pt but the pt is in an assisted living facility experiencing chest pain. Please call back and advise.

## 2014-10-18 NOTE — Telephone Encounter (Signed)
Pt has bee having Chest pain for a week and a half. The pain comes and goes. Pt took 2 NTG   SL before the pain went away yesterday. Today he has SOB and  a little chest  pain not enough to take NTG. Pt did not have a way to take his BP.

## 2014-10-20 ENCOUNTER — Ambulatory Visit: Payer: BLUE CROSS/BLUE SHIELD | Admitting: Cardiology

## 2014-10-20 ENCOUNTER — Ambulatory Visit
Admission: RE | Admit: 2014-10-20 | Discharge: 2014-10-20 | Disposition: A | Discharge status: Home / Self Care | Payer: Medicare Other | Source: Ambulatory Visit | Attending: Cardiology | Admitting: Cardiology

## 2014-10-20 ENCOUNTER — Encounter: Payer: Self-pay | Admitting: Cardiology

## 2014-10-20 ENCOUNTER — Ambulatory Visit (INDEPENDENT_AMBULATORY_CARE_PROVIDER_SITE_OTHER): Payer: Medicare Other | Admitting: Cardiology

## 2014-10-20 VITALS — BP 122/74 | HR 82 | Ht 64.0 in | Wt 184.2 lb

## 2014-10-20 DIAGNOSIS — Z87891 Personal history of nicotine dependence: Secondary | ICD-10-CM | POA: Diagnosis not present

## 2014-10-20 DIAGNOSIS — I119 Hypertensive heart disease without heart failure: Secondary | ICD-10-CM | POA: Diagnosis not present

## 2014-10-20 DIAGNOSIS — I259 Chronic ischemic heart disease, unspecified: Secondary | ICD-10-CM

## 2014-10-20 DIAGNOSIS — F0390 Unspecified dementia without behavioral disturbance: Secondary | ICD-10-CM | POA: Diagnosis not present

## 2014-10-20 DIAGNOSIS — R262 Difficulty in walking, not elsewhere classified: Secondary | ICD-10-CM | POA: Diagnosis not present

## 2014-10-20 DIAGNOSIS — I493 Ventricular premature depolarization: Secondary | ICD-10-CM | POA: Diagnosis not present

## 2014-10-20 DIAGNOSIS — R079 Chest pain, unspecified: Secondary | ICD-10-CM | POA: Diagnosis not present

## 2014-10-20 LAB — BASIC METABOLIC PANEL
BUN: 24 mg/dL — ABNORMAL HIGH (ref 6–23)
CO2: 27 mEq/L (ref 19–32)
Calcium: 10.5 mg/dL (ref 8.4–10.5)
Chloride: 104 mEq/L (ref 96–112)
Creatinine, Ser: 1.29 mg/dL (ref 0.40–1.50)
GFR: 57.03 mL/min — AB (ref >60.00)
Glucose, Bld: 71 mg/dL (ref 70–99)
POTASSIUM: 3.8 meq/L (ref 3.5–5.1)
Sodium: 143 mEq/L (ref 135–145)

## 2014-10-20 LAB — HEPATIC FUNCTION PANEL
ALBUMIN: 4.5 g/dL (ref 3.5–5.2)
ALT: 27 U/L (ref 0–53)
AST: 30 U/L (ref 0–37)
Alkaline Phosphatase: 66 U/L (ref 39–117)
Bilirubin, Direct: 0.2 mg/dL (ref 0.0–0.3)
Total Bilirubin: 0.7 mg/dL (ref 0.2–1.2)
Total Protein: 8 g/dL (ref 6.0–8.3)

## 2014-10-20 LAB — CBC WITH DIFFERENTIAL/PLATELET
BASOS ABS: 0 10*3/uL (ref 0.0–0.1)
BASOS PCT: 0.4 % (ref 0.0–3.0)
Eosinophils Absolute: 0.4 10*3/uL (ref 0.0–0.7)
Eosinophils Relative: 3.1 % (ref 0.0–5.0)
HEMATOCRIT: 45.2 % (ref 39.0–52.0)
Hemoglobin: 15.4 g/dL (ref 13.0–17.0)
LYMPHS PCT: 19.2 % (ref 12.0–46.0)
Lymphs Abs: 2.2 10*3/uL (ref 0.7–4.0)
MCHC: 34 g/dL (ref 30.0–36.0)
MCV: 93.8 fl (ref 78.0–100.0)
Monocytes Absolute: 1 10*3/uL (ref 0.1–1.0)
Monocytes Relative: 8.4 % (ref 3.0–12.0)
NEUTROS PCT: 68.9 % (ref 43.0–77.0)
Neutro Abs: 7.9 10*3/uL — ABNORMAL HIGH (ref 1.4–7.7)
Platelets: 142 10*3/uL — ABNORMAL LOW (ref 150.0–400.0)
RBC: 4.82 Mil/uL (ref 4.22–5.81)
RDW: 13.7 % (ref 11.5–15.5)
WBC: 11.5 10*3/uL — AB (ref 4.0–10.5)

## 2014-10-20 LAB — TSH: TSH: 1.45 u[IU]/mL (ref 0.35–4.50)

## 2014-10-20 MED ORDER — ISOSORBIDE MONONITRATE ER 60 MG PO TB24: 60.0000 mg | ORAL_TABLET | Freq: Every day | ORAL | Status: DC

## 2014-10-20 NOTE — Patient Instructions (Addendum)
Medication Instructions:  INCREASE YOUR IMDUR (ISOSORBIDE) TO 60 MG BY MOUTH DAILY   Labwork: CBC/BMET/HFP/TSH  Testing/Procedures: A chest x-ray takes a picture of the organs and structures inside the chest, including the heart, lungs, and blood vessels. This test can show several things, including, whether the heart is enlarges; whether fluid is building up in the lungs; and whether pacemaker / defibrillator leads are still in place. [facility]  Follow-Up: Follow up in August   Any Other Special Instructions Will Be Listed Below  Decrease your caffeine intake

## 2014-10-20 NOTE — Progress Notes (Signed)
Quick Note:  Please report to patient. The recent labs are stable. Continue same medication and careful diet. Thyroid is normal. The WBC is slightly high possibly from his recent chest cold. ______

## 2014-10-20 NOTE — Progress Notes (Signed)
Cardiology Office Note   Date:  10/20/2014   ID:  [name], DOB [birth year], MRN [id number]  PCP:  Reginia Naas, MD  Cardiologist:   Darlin Coco, MD   No chief complaint on file.     History of Present Illness: [name] is a 81 y.o. male who presents for a work in office visit  This pleasant 81 year old gentleman is seen for a work in office visit.  He has been experiencing an increase in the frequency of his chest pain for which he takes sublingual nitroglycerin.  He has a history of ischemic heart disease. He had a cardiac catheterization in 2003 which showed minimal nonobstructive coronary disease. He had a nuclear stress test in 2008 which showed no reversible ischemia.  More recently he had another Myoview stress test in December 2015 which did not show any reversible ischemia.  He does have significant hypercholesterolemia despite being on multiple medications. He is trying to adhere to a low-cholesterol diet.  He does have some early dementia  On 04/14/13 the patient had chest pain and went to the emergency room where he was evaluated. His EKG showed no acute changes and there were occasional PVCs. He had a CT angiogram of the chest which showed no evidence of pulmonary emboli. Atherosclerosis of the aorta was noted. He did not have to be admitted.  His lipids are checked by his PCP Dr. Carol Ada.  He has a history of mild left ventricular systolic dysfunction with ejection fraction of 42% in 2013 and 37% in 2015. He comes in today because of increasingly frequent chest pain.  He has also had a deep cough not productive of any sputum.  He himself has severe dementia and cannot really recall much about the details or frequency of his chest pain.  His wife states that his pain has become more frequent. He mentioned that the physical therapist at his rest home had noted some irregular heartbeat.  He does have a past history of PVCs.  Past Medical History    Diagnosis Date  . Hypertension   . Hyperlipidemia   . LV dysfunction     EF normal per echo in 2005.   . IHD (ischemic heart disease)     remote cath in 2003 with mild nonobstructive disease. negative nuclear in 2008  . Constipation   . Bruising   . Forgetfulness   . Dementia     on Aricept and Namenda  . PVC's (premature ventricular contractions)   . Ventricular arrhythmia   . Angina at rest   . Fall     Past Surgical History  Procedure Laterality Date  . Cardiac catheterization  03/21/2002    MILD GLOBAL HYPOKINESIA. EF 50%  . US echocardiography  2005    Mild AS/AI, Mild MR with a normal EF  . Tonsillectomy    . Valenta surgery      RIGHT [place]     Current Outpatient Prescriptions  Medication Sig Dispense Refill  . acetaminophen (TYLENOL) 325 MG tablet Take 2 tablets (650 mg total) by mouth every 4 (four) hours as needed for headache or mild pain.    Marland Kitchen ALPRAZolam (XANAX) 0.5 MG tablet Take 0.5 mg by mouth at bedtime.     Marland Kitchen aspirin 81 MG tablet Take 81 mg by mouth daily.     Marland Kitchen atorvastatin (LIPITOR) 80 MG tablet Take 1 tablet (80 mg total) by mouth daily. 90 tablet 3  . donepezil (ARICEPT) 10 MG  tablet Take 10 mg by mouth at bedtime.    . isosorbide mononitrate (IMDUR) 60 MG 24 hr tablet Take 1 tablet (60 mg total) by mouth daily. 30 tablet 5  . metoprolol (TOPROL-XL) 50 MG 24 hr tablet Take 50 mg by mouth daily.      . Misc Natural Products (OSTEO BI-FLEX ADV JOINT SHIELD PO) Take 1 tablet by mouth daily.     . Multiple Vitamin (MULTIVITAMIN) tablet Take 1 tablet by mouth daily.      Marland Kitchen NAMENDA XR 28 MG CP24 Take 28 mg by mouth every morning.     . nitroGLYCERIN (NITROSTAT) 0.4 MG SL tablet Place 1 tablet (0.4 mg total) under the tongue every 5 (five) minutes x 3 doses as needed for chest pain (if needed for severe chest pain or pressure). 25 tablet 2  . triamterene-hydrochlorothiazide (DYAZIDE) 37.5-25 MG per capsule Take 1 capsule by mouth daily.     No current  facility-administered medications for this visit.    Allergies:   Codeine and Crestor    Social History:  The patient  reports that he quit smoking about 23 years ago. He does not have any smokeless tobacco history on file. He reports that he does not drink alcohol or use illicit drugs.   Family History:  The patient's family history includes Alzheimer's disease in his mother; Heart attack in his father.    ROS:  Please see the history of present illness.   Otherwise, review of systems are positive for none.   All other systems are reviewed and negative.    PHYSICAL EXAM: VS:  BP 122/74 mmHg  Pulse 82  Ht 5\' 4"  (1.626 m)  Wt 184 lb 3.2 oz (83.553 kg)  BMI 31.60 kg/m2 , BMI Body mass index is 31.6 kg/(m^2). GEN: Well nourished, well developed, in no acute distress HEENT: normal Neck: no JVD, carotid bruits, or masses Cardiac: RRR; no murmurs, rubs, or gallops,no edema  Respiratory:  clear to auscultation bilaterally, normal work of breathing GI: soft, nontender, nondistended, + BS MS: no deformity or atrophy Skin: warm and dry, no rash Neuro:  Strength and sensation are intact Psych: euthymic mood, full affect   EKG:  EKG is ordered today. The ekg ordered today demonstrates since the previous tracing of 06/14/14, nonspecific T-wave flattening is new   Recent Labs: 06/03/2014: ALT 29; BUN 24*; Creatinine 0.91; Hemoglobin 13.6; Magnesium 2.4; Platelets 129*; Potassium 3.8; Pro B Natriuretic peptide (BNP) 171.7; Sodium 140; TSH 2.050    Lipid Panel    Component Value Date/Time   CHOL 165 06/04/2014 0231   TRIG 246* 06/04/2014 0231   HDL 32* 06/04/2014 0231   CHOLHDL 5.2 06/04/2014 0231   VLDL 49* 06/04/2014 0231   LDLCALC 84 06/04/2014 0231   LDLDIRECT 214.6 04/13/2012 0951      Wt Readings from Last 3 Encounters:  10/20/14 184 lb 3.2 oz (83.553 kg)  07/29/14 182 lb 1.9 oz (82.609 kg)  06/14/14 177 lb 4 oz (80.4 kg)        ASSESSMENT AND PLAN:  1. chest pain  with nonobstructive coronary disease.  Recent increase in frequency of sublingual nitroglycerin usage. 2. benign hypertensive heart disease without heart failure. 3. Chronic PVCs 4. Hypercholesterolemia followed by Dr. Dema Severin 5. atherosclerosis of aorta 6. memory disorder 7.  Cough and shortness of breath.   Current medicines are reviewed at length with the patient today.  The patient does not have concerns regarding medicines.  The following changes  have been made:  no change  Labs/ tests ordered today include:   Orders Placed This Encounter  Procedures  . DG Chest 2 View  . CBC with Differential/Platelet  . TSH  . Basic metabolic panel  . Hepatic function panel  . EKG 12-Lead     Disposition:  We are advising him to eliminate caffeine from his diet and see if that will help his PVCs. We will get a chest x-ray to evaluate his cough and shortness of breath. We will increase his isosorbide mononitrate from 30 mg daily to 60 mg daily to help his substernal chest discomfort and cut down on his sublingual nitroglycerin usage.  Because of his dementia it is not clear whether his chest discomfort is angina or not. He complains of lack of energy and we are checking basic lab work today including CBC and basal metabolic panel hepatic function panel and TSH  Signed, Darlin Coco, MD  10/20/2014 12:59 PM    [facility] [address], [place], [place]  [address] Phone: [phone]; Fax: [phone]

## 2014-10-21 DIAGNOSIS — R262 Difficulty in walking, not elsewhere classified: Secondary | ICD-10-CM | POA: Diagnosis not present

## 2014-10-24 ENCOUNTER — Telehealth: Payer: Self-pay | Admitting: Cardiology

## 2014-10-24 DIAGNOSIS — R262 Difficulty in walking, not elsewhere classified: Secondary | ICD-10-CM | POA: Diagnosis not present

## 2014-10-24 NOTE — Telephone Encounter (Signed)
Will forward to  Dr. Brackbill for review 

## 2014-10-24 NOTE — Telephone Encounter (Signed)
Yes start imdur 60

## 2014-10-24 NOTE — Telephone Encounter (Signed)
New message      Assisted Living has not been giving pt isosorbide 60mg  in am. When he went to the assisted living facility 2 months ago, this was not on his med list.  Should he start medication or what would Dr Mare Ferrari want them to do?

## 2014-10-24 NOTE — Telephone Encounter (Signed)
Discussed with  Dr. Mare Ferrari and he would like to just start at Imdur 30 mg daily for now. Signed order faxed to [facility]

## 2014-10-26 DIAGNOSIS — R262 Difficulty in walking, not elsewhere classified: Secondary | ICD-10-CM | POA: Diagnosis not present

## 2014-10-28 DIAGNOSIS — R262 Difficulty in walking, not elsewhere classified: Secondary | ICD-10-CM | POA: Diagnosis not present

## 2014-10-31 DIAGNOSIS — R262 Difficulty in walking, not elsewhere classified: Secondary | ICD-10-CM | POA: Diagnosis not present

## 2014-11-01 DIAGNOSIS — R262 Difficulty in walking, not elsewhere classified: Secondary | ICD-10-CM | POA: Diagnosis not present

## 2014-11-04 DIAGNOSIS — R262 Difficulty in walking, not elsewhere classified: Secondary | ICD-10-CM | POA: Diagnosis not present

## 2014-11-07 DIAGNOSIS — R262 Difficulty in walking, not elsewhere classified: Secondary | ICD-10-CM | POA: Diagnosis not present

## 2014-11-10 DIAGNOSIS — R262 Difficulty in walking, not elsewhere classified: Secondary | ICD-10-CM | POA: Diagnosis not present

## 2014-12-06 DIAGNOSIS — M6281 Muscle weakness (generalized): Secondary | ICD-10-CM | POA: Diagnosis not present

## 2014-12-06 DIAGNOSIS — R262 Difficulty in walking, not elsewhere classified: Secondary | ICD-10-CM | POA: Diagnosis not present

## 2014-12-06 DIAGNOSIS — R296 Repeated falls: Secondary | ICD-10-CM | POA: Diagnosis not present

## 2014-12-07 DIAGNOSIS — R296 Repeated falls: Secondary | ICD-10-CM | POA: Diagnosis not present

## 2014-12-07 DIAGNOSIS — M6281 Muscle weakness (generalized): Secondary | ICD-10-CM | POA: Diagnosis not present

## 2014-12-07 DIAGNOSIS — R262 Difficulty in walking, not elsewhere classified: Secondary | ICD-10-CM | POA: Diagnosis not present

## 2014-12-08 DIAGNOSIS — M6281 Muscle weakness (generalized): Secondary | ICD-10-CM | POA: Diagnosis not present

## 2014-12-08 DIAGNOSIS — R262 Difficulty in walking, not elsewhere classified: Secondary | ICD-10-CM | POA: Diagnosis not present

## 2014-12-08 DIAGNOSIS — R296 Repeated falls: Secondary | ICD-10-CM | POA: Diagnosis not present

## 2014-12-10 ENCOUNTER — Emergency Department (HOSPITAL_COMMUNITY): Payer: Medicare Other

## 2014-12-10 ENCOUNTER — Emergency Department (HOSPITAL_COMMUNITY)
Admission: EM | Admit: 2014-12-10 | Discharge: 2014-12-10 | Disposition: A | Discharge status: Home / Self Care | Payer: Medicare Other | Attending: Emergency Medicine | Admitting: Emergency Medicine

## 2014-12-10 ENCOUNTER — Encounter (HOSPITAL_COMMUNITY): Payer: Self-pay | Admitting: Emergency Medicine

## 2014-12-10 DIAGNOSIS — I209 Angina pectoris, unspecified: Secondary | ICD-10-CM | POA: Insufficient documentation

## 2014-12-10 DIAGNOSIS — Z7982 Long term (current) use of aspirin: Secondary | ICD-10-CM | POA: Diagnosis not present

## 2014-12-10 DIAGNOSIS — I10 Essential (primary) hypertension: Secondary | ICD-10-CM | POA: Insufficient documentation

## 2014-12-10 DIAGNOSIS — R0789 Other chest pain: Secondary | ICD-10-CM | POA: Diagnosis not present

## 2014-12-10 DIAGNOSIS — R079 Chest pain, unspecified: Secondary | ICD-10-CM | POA: Diagnosis not present

## 2014-12-10 DIAGNOSIS — Z87891 Personal history of nicotine dependence: Secondary | ICD-10-CM | POA: Insufficient documentation

## 2014-12-10 DIAGNOSIS — Z79899 Other long term (current) drug therapy: Secondary | ICD-10-CM | POA: Insufficient documentation

## 2014-12-10 DIAGNOSIS — R42 Dizziness and giddiness: Secondary | ICD-10-CM | POA: Diagnosis not present

## 2014-12-10 DIAGNOSIS — F0390 Unspecified dementia without behavioral disturbance: Secondary | ICD-10-CM | POA: Insufficient documentation

## 2014-12-10 DIAGNOSIS — R404 Transient alteration of awareness: Secondary | ICD-10-CM | POA: Diagnosis not present

## 2014-12-10 DIAGNOSIS — E785 Hyperlipidemia, unspecified: Secondary | ICD-10-CM | POA: Diagnosis not present

## 2014-12-10 LAB — BASIC METABOLIC PANEL
Anion gap: 12 (ref 5–15)
BUN: 16 mg/dL (ref 6–20)
CO2: 26 mmol/L (ref 22–32)
Calcium: 9 mg/dL (ref 8.9–10.3)
Chloride: 102 mmol/L (ref 101–111)
Creatinine, Ser: 0.99 mg/dL (ref 0.61–1.24)
GFR calc Af Amer: >60 mL/min (ref >60)
GFR calc non Af Amer: >60 mL/min (ref >60)
Glucose, Bld: 118 mg/dL — ABNORMAL HIGH (ref 65–99)
Potassium: 3.6 mmol/L (ref 3.5–5.1)
Sodium: 140 mmol/L (ref 135–145)

## 2014-12-10 LAB — CBC WITH DIFFERENTIAL/PLATELET
Basophils Absolute: 0 10*3/uL (ref 0.0–0.1)
Basophils Relative: 1 % (ref 0–1)
Eosinophils Absolute: 0.3 10*3/uL (ref 0.0–0.7)
Eosinophils Relative: 4 % (ref 0–5)
HCT: 40.7 % (ref 39.0–52.0)
Hemoglobin: 13.8 g/dL (ref 13.0–17.0)
Lymphocytes Relative: 25 % (ref 12–46)
Lymphs Abs: 1.6 10*3/uL (ref 0.7–4.0)
MCH: 31.8 pg (ref 26.0–34.0)
MCHC: 33.9 g/dL (ref 30.0–36.0)
MCV: 93.8 fL (ref 78.0–100.0)
Monocytes Absolute: 0.4 10*3/uL (ref 0.1–1.0)
Monocytes Relative: 6 % (ref 3–12)
Neutro Abs: 4.2 10*3/uL (ref 1.7–7.7)
Neutrophils Relative %: 64 % (ref 43–77)
Platelets: 125 10*3/uL — ABNORMAL LOW (ref 150–400)
RBC: 4.34 MIL/uL (ref 4.22–5.81)
RDW: 13.5 % (ref 11.5–15.5)
WBC: 6.6 10*3/uL (ref 4.0–10.5)

## 2014-12-10 LAB — TROPONIN I: Troponin I: <0.03 ng/mL (ref <0.031)

## 2014-12-10 MED ORDER — MECLIZINE HCL 25 MG PO TABS
25.0000 mg | ORAL_TABLET | Freq: Once | ORAL | Status: AC
  Administered 2014-12-10: 25 mg via ORAL
  Filled 2014-12-10: qty 1

## 2014-12-10 MED ORDER — MECLIZINE HCL 25 MG PO TABS: 25.0000 mg | ORAL_TABLET | Freq: Three times a day (TID) | ORAL | Status: AC | PRN

## 2014-12-10 NOTE — ED Notes (Signed)
Patient returned from X-ray 

## 2014-12-10 NOTE — ED Notes (Signed)
Pt arrived by [facility] from [facility] assisted living with c/o CP. Pt has had multiple falls this week and had a fall this morning, he became dizzy before he fell. A little after he fell he started having CP. Pt had Nitro x 2 at facility and ASA 324mg . Currently pt unable to say whether or not he has CP. Stated that he doesn't really feel like he is having pain at this time. EKG with EMS showed NSR with some PVCs. 20G Left [place]. Pt wife at bedside.

## 2014-12-10 NOTE — Discharge Instructions (Signed)

## 2014-12-10 NOTE — ED Provider Notes (Signed)
CSN: [id number]     Arrival date & time 12/10/14  1635 History   First MD Initiated Contact with Patient 12/10/14 1637     Chief Complaint  Patient presents with  . Chest Pain     (Consider location/radiation/quality/duration/timing/severity/associated sxs/prior Treatment) HPI   81yM with CP. Pt pleasant and jokes around but has difficulty answering questions that require much insight. History mostly from wife. Prior to arrival, pt was coming out of the bathroom and felt dizzy and apparently complained of CP per wife. He doesn't recall specifics. Similar event earlier this morning and pt actually fell at that time. No LOC. Currently he has no complaints. Not able to differentiate "dizziness" between near syncope and vertigo despite several lines of questioning.   He has a history of ischemic heart disease. He had a cardiac catheterization in 2003 which showed minimal nonobstructive coronary disease. He had a nuclear stress test in 2013 which showed no reversible ischemia and an EF of 42%. Admitted 05/2014 for observation and ruled out for MI. He had a Lexiscan cardiolite which was negative for ischemia with an EF of 37%.  Past Medical History  Diagnosis Date  . Hypertension   . Hyperlipidemia   . LV dysfunction     EF normal per echo in 2005.   . IHD (ischemic heart disease)     remote cath in 2003 with mild nonobstructive disease. negative nuclear in 2008  . Constipation   . Bruising   . Forgetfulness   . Dementia     on Aricept and Namenda  . PVC's (premature ventricular contractions)   . Ventricular arrhythmia   . Angina at rest   . Fall    Past Surgical History  Procedure Laterality Date  . Cardiac catheterization  03/21/2002    MILD GLOBAL HYPOKINESIA. EF 50%  . US echocardiography  2005    Mild AS/AI, Mild MR with a normal EF  . Tonsillectomy    . [place] surgery      RIGHT [place]   Family History  Problem Relation Age of Onset  . Alzheimer's disease Mother   . Heart  attack Father    History  Substance Use Topics  . Smoking status: Former Smoker    Quit date: 12/31/1990  . Smokeless tobacco: Not on file  . Alcohol Use: No    Review of Systems  Level 5 caveat because of dementia.    Allergies  Codeine and Crestor  Home Medications   Prior to Admission medications   Medication Sig Start Date End Date Taking? Authorizing Provider  acetaminophen (TYLENOL) 325 MG tablet Take 2 tablets (650 mg total) by mouth every 4 (four) hours as needed for headache or mild pain. 06/04/14   Erlene Quan, PA-C  ALPRAZolam Duanne Moron) 0.5 MG tablet Take 0.5 mg by mouth at bedtime.  07/09/11   Historical Provider, MD  aspirin 81 MG tablet Take 81 mg by mouth daily.     Historical Provider, MD  atorvastatin (LIPITOR) 80 MG tablet Take 1 tablet (80 mg total) by mouth daily. 04/15/12   Darlin Coco, MD  donepezil (ARICEPT) 10 MG tablet Take 10 mg by mouth at bedtime.    Historical Provider, MD  isosorbide mononitrate (IMDUR) 60 MG 24 hr tablet Take 1 tablet (60 mg total) by mouth daily. 10/20/14   Darlin Coco, MD  metoprolol (TOPROL-XL) 50 MG 24 hr tablet Take 50 mg by mouth daily.      Historical Provider, MD  Misc Natural  Products (OSTEO BI-FLEX ADV JOINT SHIELD PO) Take 1 tablet by mouth daily.     Historical Provider, MD  Multiple Vitamin (MULTIVITAMIN) tablet Take 1 tablet by mouth daily.      Historical Provider, MD  NAMENDA XR 28 MG CP24 Take 28 mg by mouth every morning.  04/13/14   Historical Provider, MD  nitroGLYCERIN (NITROSTAT) 0.4 MG SL tablet Place 1 tablet (0.4 mg total) under the tongue every 5 (five) minutes x 3 doses as needed for chest pain (if needed for severe chest pain or pressure). 06/04/14   Erlene Quan, PA-C  triamterene-hydrochlorothiazide (DYAZIDE) 37.5-25 MG per capsule Take 1 capsule by mouth daily.    Historical Provider, MD   There were no vitals taken for this visit. Physical Exam  Constitutional: He appears well-developed and  well-nourished. No distress.  HENT:  Head: Normocephalic and atraumatic.  Eyes: Conjunctivae are normal. Right eye exhibits no discharge. Left eye exhibits no discharge.  Neck: Neck supple.  Cardiovascular: Normal rate, regular rhythm and normal heart sounds.  Exam reveals no gallop and no friction rub.   No murmur heard. Pulmonary/Chest: Effort normal and breath sounds normal. No respiratory distress.  Abdominal: Soft. He exhibits no distension. There is no tenderness.  Musculoskeletal: He exhibits no edema or tenderness.  Lower extremities symmetric as compared to each other. No calf tenderness. Negative Homan's. No palpable cords.   Neurological: He is alert.  Skin: Skin is warm and dry.  Psychiatric: He has a normal mood and affect. His behavior is normal. Thought content normal.  Nursing note and vitals reviewed.   ED Course  Procedures (including critical care time) Labs Review Labs Reviewed - No data to display  Imaging Review No results found.   EKG Interpretation   Date/Time:  Saturday December 10 2014 16:37:13 EDT Ventricular Rate:  61 PR Interval:  133 QRS Duration: 95 QT Interval:  438 QTC Calculation: 441 R Axis:   38 Text Interpretation:  Sinus rhythm No significant change since last  tracing Confirmed by Rakia Frayne  MD, Rickia Freeburg ([id number]) on 12/10/2014 4:41:16 PM      MDM   Final diagnoses:  Chest pain  Dizziness    81yM with CP. Per review of records, pt has recurrent pain. ED w/u not telling. EKG stable from previous. Labs including troponin are unremarkable.  Near syncope versus vertigo. I cannot clearly differentiate with history. Neuro exam is nonfocal. Currently no symptoms. Afebrile. Normotensive. Not anemic. Lytes fine. Will give script for PRN meclizine. PCP FU. It has been determined that no acute conditions requiring further emergency intervention are present at this time. The patient has been advised of the diagnosis and plan. I reviewed any labs and  imaging including any potential incidental findings. We have discussed signs and symptoms that warrant return to the ED and they are listed in the discharge instructions.      Virgel Manifold, MD 12/10/14 Lurena Nida

## 2014-12-10 NOTE — ED Notes (Signed)
Patient transported to X-ray 

## 2014-12-13 DIAGNOSIS — R262 Difficulty in walking, not elsewhere classified: Secondary | ICD-10-CM | POA: Diagnosis not present

## 2014-12-13 DIAGNOSIS — R296 Repeated falls: Secondary | ICD-10-CM | POA: Diagnosis not present

## 2014-12-13 DIAGNOSIS — M6281 Muscle weakness (generalized): Secondary | ICD-10-CM | POA: Diagnosis not present

## 2014-12-14 DIAGNOSIS — R262 Difficulty in walking, not elsewhere classified: Secondary | ICD-10-CM | POA: Diagnosis not present

## 2014-12-14 DIAGNOSIS — M6281 Muscle weakness (generalized): Secondary | ICD-10-CM | POA: Diagnosis not present

## 2014-12-14 DIAGNOSIS — R296 Repeated falls: Secondary | ICD-10-CM | POA: Diagnosis not present

## 2014-12-14 DIAGNOSIS — R079 Chest pain, unspecified: Secondary | ICD-10-CM | POA: Diagnosis not present

## 2014-12-15 DIAGNOSIS — R296 Repeated falls: Secondary | ICD-10-CM | POA: Diagnosis not present

## 2014-12-15 DIAGNOSIS — M6281 Muscle weakness (generalized): Secondary | ICD-10-CM | POA: Diagnosis not present

## 2014-12-15 DIAGNOSIS — R262 Difficulty in walking, not elsewhere classified: Secondary | ICD-10-CM | POA: Diagnosis not present

## 2014-12-16 DIAGNOSIS — M6281 Muscle weakness (generalized): Secondary | ICD-10-CM | POA: Diagnosis not present

## 2014-12-16 DIAGNOSIS — R262 Difficulty in walking, not elsewhere classified: Secondary | ICD-10-CM | POA: Diagnosis not present

## 2014-12-16 DIAGNOSIS — R296 Repeated falls: Secondary | ICD-10-CM | POA: Diagnosis not present

## 2014-12-19 DIAGNOSIS — I10 Essential (primary) hypertension: Secondary | ICD-10-CM | POA: Diagnosis not present

## 2014-12-19 DIAGNOSIS — R296 Repeated falls: Secondary | ICD-10-CM | POA: Diagnosis not present

## 2014-12-19 DIAGNOSIS — S2239XA Fracture of one rib, unspecified side, initial encounter for closed fracture: Secondary | ICD-10-CM | POA: Diagnosis not present

## 2014-12-19 DIAGNOSIS — E782 Mixed hyperlipidemia: Secondary | ICD-10-CM | POA: Diagnosis not present

## 2014-12-19 DIAGNOSIS — F419 Anxiety disorder, unspecified: Secondary | ICD-10-CM | POA: Diagnosis not present

## 2014-12-19 DIAGNOSIS — R262 Difficulty in walking, not elsewhere classified: Secondary | ICD-10-CM | POA: Diagnosis not present

## 2014-12-19 DIAGNOSIS — F0390 Unspecified dementia without behavioral disturbance: Secondary | ICD-10-CM | POA: Diagnosis not present

## 2014-12-19 DIAGNOSIS — M6281 Muscle weakness (generalized): Secondary | ICD-10-CM | POA: Diagnosis not present

## 2014-12-20 DIAGNOSIS — R296 Repeated falls: Secondary | ICD-10-CM | POA: Diagnosis not present

## 2014-12-20 DIAGNOSIS — L570 Actinic keratosis: Secondary | ICD-10-CM | POA: Diagnosis not present

## 2014-12-20 DIAGNOSIS — L821 Other seborrheic keratosis: Secondary | ICD-10-CM | POA: Diagnosis not present

## 2014-12-20 DIAGNOSIS — M6281 Muscle weakness (generalized): Secondary | ICD-10-CM | POA: Diagnosis not present

## 2014-12-20 DIAGNOSIS — R262 Difficulty in walking, not elsewhere classified: Secondary | ICD-10-CM | POA: Diagnosis not present

## 2014-12-21 DIAGNOSIS — R262 Difficulty in walking, not elsewhere classified: Secondary | ICD-10-CM | POA: Diagnosis not present

## 2014-12-21 DIAGNOSIS — R296 Repeated falls: Secondary | ICD-10-CM | POA: Diagnosis not present

## 2014-12-21 DIAGNOSIS — M6281 Muscle weakness (generalized): Secondary | ICD-10-CM | POA: Diagnosis not present

## 2014-12-22 DIAGNOSIS — M6281 Muscle weakness (generalized): Secondary | ICD-10-CM | POA: Diagnosis not present

## 2014-12-22 DIAGNOSIS — R296 Repeated falls: Secondary | ICD-10-CM | POA: Diagnosis not present

## 2014-12-22 DIAGNOSIS — R262 Difficulty in walking, not elsewhere classified: Secondary | ICD-10-CM | POA: Diagnosis not present

## 2014-12-26 DIAGNOSIS — R296 Repeated falls: Secondary | ICD-10-CM | POA: Diagnosis not present

## 2014-12-26 DIAGNOSIS — M6281 Muscle weakness (generalized): Secondary | ICD-10-CM | POA: Diagnosis not present

## 2014-12-26 DIAGNOSIS — R262 Difficulty in walking, not elsewhere classified: Secondary | ICD-10-CM | POA: Diagnosis not present

## 2014-12-28 DIAGNOSIS — R262 Difficulty in walking, not elsewhere classified: Secondary | ICD-10-CM | POA: Diagnosis not present

## 2014-12-28 DIAGNOSIS — R296 Repeated falls: Secondary | ICD-10-CM | POA: Diagnosis not present

## 2014-12-28 DIAGNOSIS — M6281 Muscle weakness (generalized): Secondary | ICD-10-CM | POA: Diagnosis not present

## 2014-12-30 DIAGNOSIS — R296 Repeated falls: Secondary | ICD-10-CM | POA: Diagnosis not present

## 2014-12-30 DIAGNOSIS — M6281 Muscle weakness (generalized): Secondary | ICD-10-CM | POA: Diagnosis not present

## 2014-12-30 DIAGNOSIS — R262 Difficulty in walking, not elsewhere classified: Secondary | ICD-10-CM | POA: Diagnosis not present

## 2015-01-03 DIAGNOSIS — R296 Repeated falls: Secondary | ICD-10-CM | POA: Diagnosis not present

## 2015-01-03 DIAGNOSIS — R262 Difficulty in walking, not elsewhere classified: Secondary | ICD-10-CM | POA: Diagnosis not present

## 2015-01-03 DIAGNOSIS — M6281 Muscle weakness (generalized): Secondary | ICD-10-CM | POA: Diagnosis not present

## 2015-02-22 ENCOUNTER — Ambulatory Visit: Payer: Medicare Other | Admitting: Cardiology

## 2015-02-23 ENCOUNTER — Encounter: Payer: Self-pay | Admitting: Cardiology

## 2015-02-24 DIAGNOSIS — R2681 Unsteadiness on feet: Secondary | ICD-10-CM | POA: Diagnosis not present

## 2015-02-24 DIAGNOSIS — M6281 Muscle weakness (generalized): Secondary | ICD-10-CM | POA: Diagnosis not present

## 2015-02-24 DIAGNOSIS — R296 Repeated falls: Secondary | ICD-10-CM | POA: Diagnosis not present

## 2015-02-27 DIAGNOSIS — R296 Repeated falls: Secondary | ICD-10-CM | POA: Diagnosis not present

## 2015-02-27 DIAGNOSIS — M6281 Muscle weakness (generalized): Secondary | ICD-10-CM | POA: Diagnosis not present

## 2015-02-27 DIAGNOSIS — R2681 Unsteadiness on feet: Secondary | ICD-10-CM | POA: Diagnosis not present

## 2015-02-28 DIAGNOSIS — R2681 Unsteadiness on feet: Secondary | ICD-10-CM | POA: Diagnosis not present

## 2015-02-28 DIAGNOSIS — M6281 Muscle weakness (generalized): Secondary | ICD-10-CM | POA: Diagnosis not present

## 2015-02-28 DIAGNOSIS — R296 Repeated falls: Secondary | ICD-10-CM | POA: Diagnosis not present

## 2015-03-02 DIAGNOSIS — R2681 Unsteadiness on feet: Secondary | ICD-10-CM | POA: Diagnosis not present

## 2015-03-02 DIAGNOSIS — R296 Repeated falls: Secondary | ICD-10-CM | POA: Diagnosis not present

## 2015-03-07 DIAGNOSIS — R296 Repeated falls: Secondary | ICD-10-CM | POA: Diagnosis not present

## 2015-03-07 DIAGNOSIS — R2681 Unsteadiness on feet: Secondary | ICD-10-CM | POA: Diagnosis not present

## 2015-03-09 DIAGNOSIS — R2681 Unsteadiness on feet: Secondary | ICD-10-CM | POA: Diagnosis not present

## 2015-03-09 DIAGNOSIS — R296 Repeated falls: Secondary | ICD-10-CM | POA: Diagnosis not present

## 2015-03-22 DIAGNOSIS — Z961 Presence of intraocular lens: Secondary | ICD-10-CM | POA: Diagnosis not present

## 2015-03-22 DIAGNOSIS — H524 Presbyopia: Secondary | ICD-10-CM | POA: Diagnosis not present

## 2015-03-22 DIAGNOSIS — H35373 Puckering of macula, bilateral: Secondary | ICD-10-CM | POA: Diagnosis not present

## 2015-03-22 DIAGNOSIS — H02831 Dermatochalasis of right upper eyelid: Secondary | ICD-10-CM | POA: Diagnosis not present

## 2015-03-22 DIAGNOSIS — H02832 Dermatochalasis of right lower eyelid: Secondary | ICD-10-CM | POA: Diagnosis not present

## 2015-03-22 DIAGNOSIS — H02834 Dermatochalasis of left upper eyelid: Secondary | ICD-10-CM | POA: Diagnosis not present

## 2015-03-22 DIAGNOSIS — H02835 Dermatochalasis of left lower eyelid: Secondary | ICD-10-CM | POA: Diagnosis not present

## 2015-04-21 DIAGNOSIS — Z23 Encounter for immunization: Secondary | ICD-10-CM | POA: Diagnosis not present

## 2015-09-07 ENCOUNTER — Observation Stay (HOSPITAL_COMMUNITY)
Admission: EM | Admit: 2015-09-07 | Discharge: 2015-09-08 | Disposition: A | Discharge status: Nursing Home (Includes ICF) | Payer: Medicare Other | Attending: Cardiovascular Disease | Admitting: Cardiovascular Disease

## 2015-09-07 ENCOUNTER — Emergency Department (HOSPITAL_COMMUNITY): Payer: Medicare Other

## 2015-09-07 ENCOUNTER — Encounter (HOSPITAL_COMMUNITY): Payer: Self-pay | Admitting: Emergency Medicine

## 2015-09-07 DIAGNOSIS — Z87891 Personal history of nicotine dependence: Secondary | ICD-10-CM | POA: Diagnosis not present

## 2015-09-07 DIAGNOSIS — R0789 Other chest pain: Secondary | ICD-10-CM | POA: Diagnosis not present

## 2015-09-07 DIAGNOSIS — E785 Hyperlipidemia, unspecified: Secondary | ICD-10-CM | POA: Insufficient documentation

## 2015-09-07 DIAGNOSIS — I259 Chronic ischemic heart disease, unspecified: Secondary | ICD-10-CM | POA: Insufficient documentation

## 2015-09-07 DIAGNOSIS — Z7982 Long term (current) use of aspirin: Secondary | ICD-10-CM | POA: Insufficient documentation

## 2015-09-07 DIAGNOSIS — I10 Essential (primary) hypertension: Secondary | ICD-10-CM | POA: Insufficient documentation

## 2015-09-07 DIAGNOSIS — R079 Chest pain, unspecified: Secondary | ICD-10-CM | POA: Diagnosis not present

## 2015-09-07 DIAGNOSIS — E876 Hypokalemia: Secondary | ICD-10-CM | POA: Insufficient documentation

## 2015-09-07 DIAGNOSIS — Z79899 Other long term (current) drug therapy: Secondary | ICD-10-CM | POA: Diagnosis not present

## 2015-09-07 DIAGNOSIS — F0390 Unspecified dementia without behavioral disturbance: Secondary | ICD-10-CM | POA: Diagnosis not present

## 2015-09-07 DIAGNOSIS — I2510 Atherosclerotic heart disease of native coronary artery without angina pectoris: Secondary | ICD-10-CM | POA: Diagnosis present

## 2015-09-07 DIAGNOSIS — I25119 Atherosclerotic heart disease of native coronary artery with unspecified angina pectoris: Secondary | ICD-10-CM | POA: Insufficient documentation

## 2015-09-07 LAB — CBC
HCT: 41.4 % (ref 39.0–52.0)
Hemoglobin: 14.3 g/dL (ref 13.0–17.0)
MCH: 31.8 pg (ref 26.0–34.0)
MCHC: 34.5 g/dL (ref 30.0–36.0)
MCV: 92.2 fL (ref 78.0–100.0)
PLATELETS: 108 10*3/uL — AB (ref 150–400)
RBC: 4.49 MIL/uL (ref 4.22–5.81)
RDW: 13.3 % (ref 11.5–15.5)
WBC: 6.2 10*3/uL (ref 4.0–10.5)

## 2015-09-07 LAB — BASIC METABOLIC PANEL
Anion gap: 11 (ref 5–15)
BUN: 14 mg/dL (ref 6–20)
CALCIUM: 9.4 mg/dL (ref 8.9–10.3)
CHLORIDE: 104 mmol/L (ref 101–111)
CO2: 29 mmol/L (ref 22–32)
CREATININE: 1.15 mg/dL (ref 0.61–1.24)
GFR calc Af Amer: >60 mL/min (ref >60)
GFR calc non Af Amer: 58 mL/min — ABNORMAL LOW (ref >60)
GLUCOSE: 119 mg/dL — AB (ref 65–99)
Potassium: 3.3 mmol/L — ABNORMAL LOW (ref 3.5–5.1)
Sodium: 144 mmol/L (ref 135–145)

## 2015-09-07 LAB — TROPONIN I: Troponin I: <0.03 ng/mL (ref <0.031)

## 2015-09-07 LAB — I-STAT TROPONIN, ED: TROPONIN I, POC: 0 ng/mL (ref 0.00–0.08)

## 2015-09-07 MED ORDER — ICOSAPENT ETHYL 1 G PO CAPS: 2.0000 | ORAL_CAPSULE | Freq: Two times a day (BID) | ORAL | Status: DC

## 2015-09-07 MED ORDER — NITROGLYCERIN 0.4 MG SL SUBL: 0.4000 mg | SUBLINGUAL_TABLET | SUBLINGUAL | Status: DC | PRN

## 2015-09-07 MED ORDER — ALPRAZOLAM 0.5 MG PO TABS
0.5000 mg | ORAL_TABLET | Freq: Two times a day (BID) | ORAL | Status: DC | PRN
  Administered 2015-09-07: 0.5 mg via ORAL
  Filled 2015-09-07: qty 1

## 2015-09-07 MED ORDER — MEMANTINE HCL ER 28 MG PO CP24
28.0000 mg | ORAL_CAPSULE | Freq: Every day | ORAL | Status: DC
  Administered 2015-09-07 – 2015-09-08 (×2): 28 mg via ORAL
  Filled 2015-09-07 (×4): qty 1

## 2015-09-07 MED ORDER — ASPIRIN 81 MG PO CHEW
81.0000 mg | CHEWABLE_TABLET | Freq: Every day | ORAL | Status: DC
  Administered 2015-09-08: 81 mg via ORAL
  Filled 2015-09-07: qty 1

## 2015-09-07 MED ORDER — METOPROLOL SUCCINATE ER 50 MG PO TB24
50.0000 mg | ORAL_TABLET | Freq: Every day | ORAL | Status: DC
  Administered 2015-09-07 – 2015-09-08 (×2): 50 mg via ORAL
  Filled 2015-09-07: qty 1
  Filled 2015-09-07: qty 2

## 2015-09-07 MED ORDER — OMEGA-3-ACID ETHYL ESTERS 1 G PO CAPS
1.0000 g | ORAL_CAPSULE | Freq: Two times a day (BID) | ORAL | Status: DC
  Administered 2015-09-07 – 2015-09-08 (×3): 1 g via ORAL
  Filled 2015-09-07 (×3): qty 1

## 2015-09-07 MED ORDER — MECLIZINE HCL 25 MG PO TABS: 25.0000 mg | ORAL_TABLET | Freq: Three times a day (TID) | ORAL | Status: DC | PRN

## 2015-09-07 MED ORDER — ISOSORBIDE MONONITRATE ER 30 MG PO TB24
30.0000 mg | ORAL_TABLET | Freq: Every day | ORAL | Status: DC
  Administered 2015-09-08: 30 mg via ORAL
  Filled 2015-09-07 (×2): qty 1

## 2015-09-07 MED ORDER — ONDANSETRON HCL 4 MG/2ML IJ SOLN: 4.0000 mg | Freq: Four times a day (QID) | INTRAMUSCULAR | Status: DC | PRN

## 2015-09-07 MED ORDER — DONEPEZIL HCL 10 MG PO TABS
10.0000 mg | ORAL_TABLET | Freq: Every day | ORAL | Status: DC
  Administered 2015-09-07: 10 mg via ORAL
  Filled 2015-09-07: qty 1

## 2015-09-07 MED ORDER — ENOXAPARIN SODIUM 40 MG/0.4ML ~~LOC~~ SOLN
40.0000 mg | SUBCUTANEOUS | Status: DC
  Administered 2015-09-07: 40 mg via SUBCUTANEOUS
  Filled 2015-09-07: qty 0.4

## 2015-09-07 MED ORDER — ASPIRIN 81 MG PO CHEW: 324.0000 mg | CHEWABLE_TABLET | Freq: Once | ORAL | Status: DC

## 2015-09-07 MED ORDER — ATORVASTATIN CALCIUM 40 MG PO TABS
40.0000 mg | ORAL_TABLET | Freq: Every day | ORAL | Status: DC
  Administered 2015-09-07 – 2015-09-08 (×2): 40 mg via ORAL
  Filled 2015-09-07: qty 4
  Filled 2015-09-07: qty 1

## 2015-09-07 MED ORDER — ACETAMINOPHEN 325 MG PO TABS: 650.0000 mg | ORAL_TABLET | Freq: Two times a day (BID) | ORAL | Status: DC | PRN

## 2015-09-07 MED ORDER — NITROGLYCERIN 0.4 MG SL SUBL
0.4000 mg | SUBLINGUAL_TABLET | SUBLINGUAL | Status: DC | PRN
Start: 2015-09-07 — End: 2015-09-08
  Filled 2015-09-07: qty 1

## 2015-09-07 MED ORDER — ADULT MULTIVITAMIN W/MINERALS CH
1.0000 | ORAL_TABLET | Freq: Every day | ORAL | Status: DC
  Administered 2015-09-07 – 2015-09-08 (×2): 1 via ORAL
  Filled 2015-09-07 (×2): qty 1

## 2015-09-07 NOTE — ED Notes (Signed)
Patient returned from X-ray 

## 2015-09-07 NOTE — ED Notes (Signed)
Cardiology PA at the bedside

## 2015-09-07 NOTE — ED Notes (Signed)
Pt ordered heart healthy diet per Cards PA approval

## 2015-09-07 NOTE — ED Notes (Addendum)
Woke up this am with left sided rib pain and cp mostly lower left side has not fallen no sob no nor v occ pvc which is new hx of slight ddementia , pt and wife from [facility]. Per wife pt has had runny nose and cough

## 2015-09-07 NOTE — ED Provider Notes (Signed)
CSN: [id number]     Arrival date & time 09/07/15  0840 History   First MD Initiated Contact with Patient 09/07/15 [id number]     Chief Complaint  Patient presents with  . Chest Pain     (Consider location/radiation/quality/duration/timing/severity/associated sxs/prior Treatment) HPI  A LEVEL 5 CAVEAT PERTAINS DUE TO DEMENTIA Pt presenting with c/o left sided chest pain.  Pt points to his left chest when describing the pain.  Is not able to describe to me further.  Denies shortness of breath.  Wife states she woke him up this morning complaining of the pain.  He has also had some runny nose and cough recently.    Past Medical History  Diagnosis Date  . Hypertension   . Hyperlipidemia   . LV dysfunction     EF normal per echo in 2005.   . IHD (ischemic heart disease)     remote cath in 2003 with mild nonobstructive disease. negative nuclear in 2008  . Constipation   . Bruising   . Forgetfulness   . Dementia     on Aricept and Namenda  . PVC's (premature ventricular contractions)   . Ventricular arrhythmia   . Angina at rest [facility])   . Fall    Past Surgical History  Procedure Laterality Date  . Cardiac catheterization  03/21/2002    MILD GLOBAL HYPOKINESIA. EF 50%  . US echocardiography  2005    Mild AS/AI, Mild MR with a normal EF  . Tonsillectomy    . [place] surgery      RIGHT [place]   Family History  Problem Relation Age of Onset  . Alzheimer's disease Mother   . Heart attack Father    Social History  Substance Use Topics  . Smoking status: Former Smoker    Quit date: 12/31/1990  . Smokeless tobacco: None  . Alcohol Use: No    Review of Systems  UNABLE TO OBTAIN ROS DUE TO LEVEL 5 CAVEAT    Allergies  Codeine and Crestor  Home Medications   Prior to Admission medications   Medication Sig Start Date End Date Taking? Authorizing Provider  acetaminophen (TYLENOL) 650 MG CR tablet Take 650 mg by mouth every 12 (twelve) hours as needed for pain.   Yes Historical  Provider, MD  ALPRAZolam Duanne Moron) 0.5 MG tablet Take 0.5 mg by mouth every 12 (twelve) hours as needed for anxiety or sleep.  07/09/11  Yes Historical Provider, MD  aspirin 81 MG chewable tablet Chew 81 mg by mouth daily.   Yes Historical Provider, MD  atorvastatin (LIPITOR) 40 MG tablet Take 40 mg by mouth daily.   Yes Historical Provider, MD  donepezil (ARICEPT) 10 MG tablet Take 10 mg by mouth at bedtime.   Yes Historical Provider, MD  isosorbide mononitrate (IMDUR) 30 MG 24 hr tablet Take 30 mg by mouth daily.   Yes Historical Provider, MD  meclizine (ANTIVERT) 25 MG tablet Take 1 tablet (25 mg total) by mouth 3 (three) times daily as needed for dizziness. 12/10/14  Yes Virgel Manifold, MD  memantine (NAMENDA XR) 28 MG CP24 24 hr capsule Take 28 mg by mouth daily.   Yes Historical Provider, MD  metoprolol (TOPROL-XL) 50 MG 24 hr tablet Take 50 mg by mouth daily.     Yes Historical Provider, MD  Multiple Vitamin (MULTIVITAMIN WITH MINERALS) TABS tablet Take 1 tablet by mouth daily.   Yes Historical Provider, MD  triamterene-hydrochlorothiazide (MAXZIDE-25) 37.5-25 MG per tablet Take 1 tablet by mouth  daily.   Yes Historical Provider, MD  VASCEPA 1 g CAPS Take 2 capsules by mouth 2 (two) times daily. 08/26/15  Yes Historical Provider, MD  nitroGLYCERIN (NITROSTAT) 0.4 MG SL tablet Place 1 tablet (0.4 mg total) under the tongue every 5 (five) minutes x 3 doses as needed for chest pain (if needed for severe chest pain or pressure). Patient not taking: Reported on 12/10/2014 06/04/14   Doreene Burke Kilroy, PA-C   BP 123/65 mmHg  Pulse 64  Temp(Src) 98 F (36.7 C) (Oral)  Resp 17  Wt 85.5 kg  SpO2 96%  Vitals reviewed Physical Exam  Physical Examination: General appearance - alert, well appearing, and in no distress Mental status - alert, oriented to person, place, and time Eyes - no conjunctival injection no scleral icterus Mouth - mucous membranes moist, pharynx normal without lesions Chest - clear to  auscultation, no wheezes, rales or rhonchi, symmetric air entry Heart - normal rate, regular rhythm, normal S1, S2, no murmurs, rubs, clicks or gallops Abdomen - soft, nontender, nondistended, no masses or organomegaly Neurological - alert, oriented, normal speech Extremities - peripheral pulses normal, no pedal edema, no clubbing or cyanosis Skin - normal coloration and turgor, no rashes  ED Course  Procedures (including critical care time) Labs Review Labs Reviewed  BASIC METABOLIC PANEL - Abnormal; Notable for the following:    Potassium 3.3 (*)    Glucose, Bld 119 (*)    GFR calc non Af Amer 58 (*)    All other components within normal limits  CBC - Abnormal; Notable for the following:    Platelets 108 (*)    All other components within normal limits  BASIC METABOLIC PANEL - Abnormal; Notable for the following:    Potassium 2.9 (*)    Glucose, Bld 142 (*)    All other components within normal limits  TROPONIN I  TROPONIN I  TROPONIN I  I-STAT TROPOININ, ED    Imaging Review Nm Myocar Multi W/spect W/wall Motion / Ef  09/08/2015  CLINICAL DATA:  Hypertension. Angina at rest. Coronary artery disease. EXAM: MYOCARDIAL IMAGING WITH SPECT (REST AND PHARMACOLOGIC-STRESS) GATED LEFT VENTRICULAR WALL MOTION STUDY LEFT VENTRICULAR EJECTION FRACTION TECHNIQUE: Standard myocardial SPECT imaging was performed after resting intravenous injection of 10 mCi Tc-37m sestamibi. Subsequently, intravenous infusion of Lexiscan was performed under the supervision of the Cardiology staff. At peak effect of the drug, 30 mCi Tc-39m sestamibi was injected intravenously and standard myocardial SPECT imaging was performed. Quantitative gated imaging was also performed to evaluate left ventricular wall motion, and estimate left ventricular ejection fraction. COMPARISON:  Nuclear medicine cardiac stress exam 06/04/2014 FINDINGS: Perfusion: No decreased activity in the left ventricle on stress imaging to suggest  reversible ischemia or infarction. Wall Motion: There is mild global hypokinesis without focal wall motion abnormality. Left Ventricular Ejection Fraction: 40 % End diastolic volume 70 ml End systolic volume 46 ml IMPRESSION: 1. No reversible ischemia or infarction. 2. Mild global hypokinesia without focal wall motion abnormality. 3. Left ventricular ejection fraction 40% 4. Intermediate-risk stress test findings*. *2012 Appropriate Use Criteria for Coronary Revascularization Focused Update: J Am Coll Cardiol. [id number]. http://content.airportbarriers.com.aspx?articleid=[email or web address] Electronically Signed   By: San Morelle M.D.   On: 09/08/2015 11:00   I have personally reviewed and evaluated these images and lab results as part of my medical decision-making.   EKG Interpretation   Date/Time:  Thursday September 07 2015 08:43:56 EST Ventricular Rate:  57 PR Interval:  141 QRS Duration: 97  QT Interval:  466 QTC Calculation: 454 R Axis:   4 Text Interpretation:  Sinus rhythm Atrial premature complex Abnormal  R-wave progression, early transition Baseline wander in lead(s) V2 No  significant change since last tracing Confirmed by [facility]  MD, Antia Rahal  [phone]) on 09/07/2015 9:28:04 AM      MDM   Final diagnoses:  Chest pain    Pt presenting with c/o chest pain.    10:32 AM labs returned and reassuring, d/w cardiology who will see patient in the ED.  Pt remains chest pain free.  Cardiology has seen patient and plans for admission.    Alfonzo Beers, MD 09/09/15 [phone]

## 2015-09-07 NOTE — H&P (Signed)
History & Physical    Patient ID: [name] MRN: [id number], DOB/AGE: 80-Dec-1934   Admit date: 09/07/2015   Primary Physician: Reginia Naas, MD Primary Cardiologist: Dr. Mare Ferrari --> Now Dr. Tamala Julian   History of Present Illness    [name] is a 82 y.o. male with past medical history of mild non-obstructive CAD (by cath in 2003), HTN, HLD, and Dementia who presents to [facility] ED on 09/07/2015 for evaluation of chest pain.  The patient is a very poor historian and looks to his wife and son for answers when asked a question. He is able to answer questions about his current state.   His wife reports he woke her up at 0700 this morning saying he was having severe pain along his left chest and the pain had woke him from sleep. He is unable to elaborate on the type of pain. His wife is unaware of any associated nausea, vomiting, or diaphoresis. She reports by the time he arrived to the hospital, his pain had completely resolved. She believes he was given ASA and SL NTG in transit to the ED.   She reports he had an additional episode of chest pain this past weekend. Again, him nor his wife can elaborate on the details. She reports they both had a mild cough over two weeks ago, but this has significantly improved. Reports several residents at [facility] had this similar "cold" but no confirmed cases of the flu or pneumonia.  At the time of this encounter, he denies any current chest pain. He is alert and oriented to person and place but continually forgets why he is here.  CBC showed WBC of 6.2. Hgb 14.3. Platelets 108. BMET with K+ of 3.3. Creatinine stable at 1.15. Initial troponin negative. CXR shows stable mild chronic lung disease with no acute cardiopulmonary process. EKG shows NSR with PAC's and no acute ST or T-wave changes.  His most recent Burchard was in 05/2014 and showed no reversible ischemia or infarct. EF was reduced at 37%. Overall, it was a low-risk study.  No further ischemic evaluation since.  Past Medical History    Past Medical History  Diagnosis Date  . Hypertension   . Hyperlipidemia   . LV dysfunction     EF normal per echo in 2005.   . IHD (ischemic heart disease)     remote cath in 2003 with mild nonobstructive disease. negative nuclear in 2008  . Constipation   . Bruising   . Forgetfulness   . Dementia     on Aricept and Namenda  . PVC's (premature ventricular contractions)   . Ventricular arrhythmia   . Angina at rest [facility])   . Fall     Past Surgical History  Procedure Laterality Date  . Cardiac catheterization  03/21/2002    MILD GLOBAL HYPOKINESIA. EF 50%  . US echocardiography  2005    Mild AS/AI, Mild MR with a normal EF  . Tonsillectomy    . [place] surgery      RIGHT Morrical     Allergies  Allergies  Allergen Reactions  . Codeine Other (See Comments)    Anxiety and nervous feelings   . Crestor [Rosuvastatin Calcium] Other (See Comments)    Muscle pains in knees     Home Medications    Prior to Admission medications   Medication Sig Start Date End Date Taking? Authorizing Provider  acetaminophen (TYLENOL) 650 MG CR tablet Take 650 mg by mouth every  12 (twelve) hours as needed for pain.   Yes Historical Provider, MD  ALPRAZolam Duanne Moron) 0.5 MG tablet Take 0.5 mg by mouth every 12 (twelve) hours as needed for anxiety or sleep.  07/09/11  Yes Historical Provider, MD  aspirin 81 MG chewable tablet Chew 81 mg by mouth daily.   Yes Historical Provider, MD  atorvastatin (LIPITOR) 40 MG tablet Take 40 mg by mouth daily.   Yes Historical Provider, MD  donepezil (ARICEPT) 10 MG tablet Take 10 mg by mouth at bedtime.   Yes Historical Provider, MD  isosorbide mononitrate (IMDUR) 30 MG 24 hr tablet Take 30 mg by mouth daily.   Yes Historical Provider, MD  meclizine (ANTIVERT) 25 MG tablet Take 1 tablet (25 mg total) by mouth 3 (three) times daily as needed for dizziness. 12/10/14  Yes Virgel Manifold, MD  memantine  (NAMENDA XR) 28 MG CP24 24 hr capsule Take 28 mg by mouth daily.   Yes Historical Provider, MD  metoprolol (TOPROL-XL) 50 MG 24 hr tablet Take 50 mg by mouth daily.     Yes Historical Provider, MD  Multiple Vitamin (MULTIVITAMIN WITH MINERALS) TABS tablet Take 1 tablet by mouth daily.   Yes Historical Provider, MD  triamterene-hydrochlorothiazide (MAXZIDE-25) 37.5-25 MG per tablet Take 1 tablet by mouth daily.   Yes Historical Provider, MD  VASCEPA 1 g CAPS Take 2 capsules by mouth 2 (two) times daily. 08/26/15  Yes Historical Provider, MD  acetaminophen (TYLENOL) 325 MG tablet Take 2 tablets (650 mg total) by mouth every 4 (four) hours as needed for headache or mild pain. Patient not taking: Reported on 12/10/2014 06/04/14   Erlene Quan, PA-C  atorvastatin (LIPITOR) 80 MG tablet Take 1 tablet (80 mg total) by mouth daily. Patient not taking: Reported on 12/10/2014 04/15/12   Darlin Coco, MD  isosorbide mononitrate (IMDUR) 60 MG 24 hr tablet Take 1 tablet (60 mg total) by mouth daily. Patient not taking: Reported on 12/10/2014 10/20/14   Darlin Coco, MD  nitroGLYCERIN (NITROSTAT) 0.4 MG SL tablet Place 1 tablet (0.4 mg total) under the tongue every 5 (five) minutes x 3 doses as needed for chest pain (if needed for severe chest pain or pressure). Patient not taking: Reported on 12/10/2014 06/04/14   Erlene Quan, PA-C    Family History    Family History  Problem Relation Age of Onset  . Alzheimer's disease Mother   . Heart attack Father     Social History    Social History   Social History  . Marital Status: Married    Spouse Name: N/A  . Number of Children: N/A  . Years of Education: N/A   Occupational History  . Not on file.   Social History Main Topics  . Smoking status: Former Smoker    Quit date: 12/31/1990  . Smokeless tobacco: Not on file  . Alcohol Use: No  . Drug Use: No  . Sexual Activity: No   Other Topics Concern  . Not on file   Social History Narrative      Review of Systems    General:  No chills, fever, night sweats or weight changes.  Cardiovascular:  No dyspnea on exertion, edema, orthopnea, palpitations, paroxysmal nocturnal dyspnea. Positive for chest pain. Dermatological: No rash, lesions/masses Respiratory: No dyspnea. Positive for cough (resolved last week). Urologic: No hematuria, dysuria Abdominal:   No nausea, vomiting, diarrhea, bright red blood per rectum, melena, or hematemesis Neurologic:  No visual changes, wkns, changes in  mental status. All other systems reviewed and are otherwise negative except as noted above.  Physical Exam    Blood pressure 143/71, pulse 60, temperature 97.8 F (36.6 C), temperature source Oral, resp. rate 19, SpO2 94 %.  General: Elderly Caucasian,male appearing in no acute distress. Head: Normocephalic, atraumatic, sclera non-icteric, no xanthomas, nares are without discharge. Dentition:  Neck: No carotid bruits. JVD not elevated.  Lungs: Respirations regular and unlabored, without wheezes or rales.  Heart: Regular rate and rhythm. No S3 or S4.  No murmur, no rubs, or gallops appreciated. Abdomen: Soft, non-tender, non-distended with normoactive bowel sounds. No hepatomegaly. No rebound/guarding. No obvious abdominal masses. Msk:  Strength and tone appear normal for age. No joint deformities or effusions. Extremities: No clubbing or cyanosis. No edema.  Distal pedal pulses are 2+ bilaterally. Neuro: Alert and oriented X 2 (person, place). Moves all extremities spontaneously. No focal deficits noted. Psych:  Responds to questions appropriately with a normal affect. Skin: No rashes or lesions noted  Labs    Troponin [facility] Test)  Recent Labs  09/07/15 0948  TROPIPOC 0.00   No results for input(s): CKTOTAL, CKMB, TROPONINI in the last 72 hours. Lab Results  Component Value Date   WBC 6.2 09/07/2015   HGB 14.3 09/07/2015   HCT 41.4 09/07/2015   MCV 92.2 09/07/2015   PLT 108*  09/07/2015     Recent Labs Lab 09/07/15 0941  NA 144  K 3.3*  CL 104  CO2 29  BUN 14  CREATININE 1.15  CALCIUM 9.4  GLUCOSE 119*   Lab Results  Component Value Date   CHOL 165 06/04/2014   HDL 32* 06/04/2014   LDLCALC 84 06/04/2014   TRIG 246* 06/04/2014   Lab Results  Component Value Date   DDIMER 0.63* 04/14/2013    No results found for: BNP PRO B NATRIURETIC PEPTIDE (BNP)  Date/Time Value Ref Range Status  06/03/2014 02:52 PM 171.7 0 - 450 pg/mL Final   No results for input(s): INR in the last 72 hours.    Radiology Studies    Dg Chest 2 View: 09/07/2015  CLINICAL DATA:  Left-sided chest pain today. No acute injury. History of hypertension and ventricular arrhythmia. EXAM: CHEST  2 VIEW COMPARISON:  12/10/2014 and 10/20/2014. FINDINGS: The heart size is at the upper limits of normal for AP technique. The lungs appear stable with probable mild chronic basilar scarring. There is no airspace disease, edema or significant pleural effusion. The bones appear unchanged with a chronic lower thoracic compression deformity. IMPRESSION: Stable mild chronic lung disease.  No acute cardiopulmonary process. Electronically Signed   By: Richardean Sale M.D.   On: 09/07/2015 09:34    EKG & Cardiac Imaging    EKG: NSR, HR 57, Occasional PAC noted.   ECHOCARDIOGRAM: 09/12/2011 Study Conclusions - Left ventricle: The cavity size was normal. Wall thickness was normal. Systolic function was mildly reduced. The estimated ejection fraction was in the range of 45% to 50%. Diffuse hypokinesis. Doppler parameters are consistent with abnormal left ventricular relaxation (grade 1 diastolic dysfunction). - Aortic valve: Valve mobility was mildly restricted. Mild regurgitation. - Mitral valve: Calcified annulus. Mild regurgitation. Transthoracic echocardiography. M-mode, complete 2D, spectral Doppler, and color Doppler. Height: Height: 172.7cm. Height: 68in. Weight:  Weight: 73.9kg. Weight: 162.7lb. Body mass index: BMI: 24.8kg/m^2. Body surface area:  BSA: 1.53m^2. Blood pressure:   128/73. Patient status: Outpatient. Location: [facility]  Assessment & Plan    1. Chest  Pain/ Mild CAD - history of mild non-obstructive CAD by cath in 2003. Last Carlton Adam was in 05/2014 showed no evidence of ischemia. - the patient is unable to provide much history surrounding his events, but the wife is able to provide history saying the pain awoke him from sleep and he had a previous episode of chest pain this past weekend. - initial troponin negative and EKG without acute ischemic changes. - will plan for Lexiscan Myoview in the AM to further evaluate his symptoms. This was discussed with the patient, his wife, and their son. - continue ASA, statin, Imdur, and BB.   2. HTN - BP has been 130/60 - 143/77 while admitted. - continue PTA Imdur and Toprol-XL. Maxzide currently held to avoid hypotension. Can likely resume prior to discharge.  3. HLD - continue statin therapy.  4. Dementia - the patient is a poor historian and most history is provided by his wife and son - continue PTA Aricept and Namenda.    Signed, Erma Heritage, PA-C 09/07/2015, 12:12 PM Pager: [phone]  Patient examined chart reviewed. Most of history from son and wife. Patient has significant dementia SSCP resloved ECG ok and enzymes negative  Last myovue 2015 normal.  Reasonable to repeat in  Am.  Exam benign except for poor memory .  Continue aricept and namenda for dementia No bradycardia On statin   Jenkins Rouge

## 2015-09-08 ENCOUNTER — Other Ambulatory Visit (HOSPITAL_COMMUNITY): Payer: Medicare Other

## 2015-09-08 ENCOUNTER — Observation Stay (HOSPITAL_COMMUNITY): Payer: Medicare Other

## 2015-09-08 DIAGNOSIS — R9439 Abnormal result of other cardiovascular function study: Secondary | ICD-10-CM | POA: Diagnosis not present

## 2015-09-08 DIAGNOSIS — R0789 Other chest pain: Secondary | ICD-10-CM | POA: Diagnosis not present

## 2015-09-08 DIAGNOSIS — R079 Chest pain, unspecified: Secondary | ICD-10-CM | POA: Diagnosis not present

## 2015-09-08 DIAGNOSIS — I10 Essential (primary) hypertension: Secondary | ICD-10-CM | POA: Diagnosis not present

## 2015-09-08 DIAGNOSIS — E785 Hyperlipidemia, unspecified: Secondary | ICD-10-CM | POA: Diagnosis not present

## 2015-09-08 DIAGNOSIS — F0390 Unspecified dementia without behavioral disturbance: Secondary | ICD-10-CM | POA: Diagnosis not present

## 2015-09-08 LAB — BASIC METABOLIC PANEL
Anion gap: 13 (ref 5–15)
BUN: 20 mg/dL (ref 6–20)
CHLORIDE: 101 mmol/L (ref 101–111)
CO2: 26 mmol/L (ref 22–32)
CREATININE: 1.08 mg/dL (ref 0.61–1.24)
Calcium: 9.1 mg/dL (ref 8.9–10.3)
Glucose, Bld: 142 mg/dL — ABNORMAL HIGH (ref 65–99)
POTASSIUM: 2.9 mmol/L — AB (ref 3.5–5.1)
SODIUM: 140 mmol/L (ref 135–145)

## 2015-09-08 LAB — NM MYOCAR MULTI W/SPECT W/WALL MOTION / EF
CSEPED: 0 min
CSEPPHR: 91 {beats}/min
Estimated workload: 1 METS
Exercise duration (sec): 0 s
MPHR: 140 {beats}/min
Percent HR: 65 %
Rest HR: 54 {beats}/min

## 2015-09-08 LAB — TROPONIN I

## 2015-09-08 MED ORDER — REGADENOSON 0.4 MG/5ML IV SOLN
0.4000 mg | Freq: Once | INTRAVENOUS | Status: AC
  Administered 2015-09-08: 0.4 mg via INTRAVENOUS
  Filled 2015-09-08: qty 5

## 2015-09-08 MED ORDER — REGADENOSON 0.4 MG/5ML IV SOLN
INTRAVENOUS | Status: AC
  Filled 2015-09-08: qty 5

## 2015-09-08 MED ORDER — POTASSIUM CHLORIDE CRYS ER 20 MEQ PO TBCR
40.0000 meq | EXTENDED_RELEASE_TABLET | Freq: Once | ORAL | Status: AC
  Administered 2015-09-08: 40 meq via ORAL
  Filled 2015-09-08: qty 2

## 2015-09-08 NOTE — Discharge Summary (Signed)
Discharge Summary    Patient ID: [name],  MRN: [id number], DOB/AGE: 1932-09-26 82 y.o.  Admit date: 09/07/2015 Discharge date: 09/08/2015  Primary Care Provider: [facility] Primary Cardiologist: Dr. Mare Ferrari --> Now Dr. Tamala Julian  Discharge Diagnoses    Principal Problem:   Chest pain Active Problems:   Dementia   CAD (coronary artery disease)- mild 2003  History of Present Illness     [name] is a 82 y.o. male with past medical history of mild non-obstructive CAD (by cath in 2003), HTN, HLD, and Dementia who presented to [facility] ED on 09/07/2015 for evaluation of chest pain.  The patient is a very poor historian and history was obtained from his wife and son.  His wife reports he woke her up at 0700 that morning with a left-sided chest which woke him from sleep. He was unable to elaborate on the type of pain. His wife was unaware of any associated nausea, vomiting, or diaphoresis. By the time he arrived to the hospital, his pain had completely resolved. She reported an additional episode of chest pain this past weekend. Again, him nor his wife can elaborate on the details. She reports they both had a mild cough over two weeks ago, but this has significantly improved. Reports several residents at [facility] had this similar "cold" but no confirmed cases of the flu or pneumonia.  While in the ED, CBC showed WBC of 6.2. Hgb 14.3. Platelets 108. BMET with K+ of 3.3. Creatinine stable at 1.15. Initial troponin negative. CXR shows stable mild chronic lung disease with no acute cardiopulmonary process. EKG shows NSR with PAC's and no acute ST or T-wave changes.  ACS rule-out and a repeat stress test was recommended.    Hospital Course     Consultants: None  The following morning, he denied any repeat episodes of chest pain. His BMET showed hypokalemia with K+ of  2.9. This was appropriately replaced prior to discharge. Telemetry was reviewed and showed no  arrhythmias. His cyclic troponin values remained negative. He underwent a nuclear stress test which showed no evidence of ischemia or infarction. His EF was read as 40% which is consistent with the EF on his previous study in 2013.  The patient was last examined by Dr. Johnsie Cancel and deemed stable for discharge back to his assisted living facility. His family wishes to follow-up with Cardiology on an as needed basis.   Discharge Vitals Blood pressure 123/65, pulse 64, temperature 98 F (36.7 C), temperature source Oral, resp. rate 17, weight 188 lb 7.9 oz (85.5 kg), SpO2 96 %.  Filed Weights   09/08/15 0634  Weight: 188 lb 7.9 oz (85.5 kg)    Labs & Radiologic Studies     CBC  Recent Labs  09/07/15 0941  WBC 6.2  HGB 14.3  HCT 41.4  MCV 92.2  PLT 123XX123*   Basic Metabolic Panel  Recent Labs  09/07/15 0941 09/08/15 0207  NA 144 140  K 3.3* 2.9*  CL 104 101  CO2 29 26  GLUCOSE 119* 142*  BUN 14 20  CREATININE 1.15 1.08  CALCIUM 9.4 9.1   Liver Function Tests No results for input(s): AST, ALT, ALKPHOS, BILITOT, PROT, ALBUMIN in the last 72 hours. No results for input(s): LIPASE, AMYLASE in the last 72 hours. Cardiac Enzymes  Recent Labs  09/07/15 1606 09/07/15 2100 09/08/15 0207  TROPONINI <0.03 <0.03 <0.03    Dg Chest 2 View: 09/07/2015  CLINICAL DATA:  Left-sided chest pain today. No acute injury. History of hypertension and ventricular arrhythmia. EXAM: CHEST  2 VIEW COMPARISON:  12/10/2014 and 10/20/2014. FINDINGS: The heart size is at the upper limits of normal for AP technique. The lungs appear stable with probable mild chronic basilar scarring. There is no airspace disease, edema or significant pleural effusion. The bones appear unchanged with a chronic lower thoracic compression deformity. IMPRESSION: Stable mild chronic lung disease.  No acute cardiopulmonary process. Electronically Signed   By: Richardean Sale M.D.   On: 09/07/2015 09:34   Nm Myocar Multi W/spect  W/wall Motion / Ef: 09/08/2015  CLINICAL DATA:  Hypertension. Angina at rest. Coronary artery disease. EXAM: MYOCARDIAL IMAGING WITH SPECT (REST AND PHARMACOLOGIC-STRESS) GATED LEFT VENTRICULAR WALL MOTION STUDY LEFT VENTRICULAR EJECTION FRACTION TECHNIQUE: Standard myocardial SPECT imaging was performed after resting intravenous injection of 10 mCi Tc-78m sestamibi. Subsequently, intravenous infusion of Lexiscan was performed under the supervision of the Cardiology staff. At peak effect of the drug, 30 mCi Tc-76m sestamibi was injected intravenously and standard myocardial SPECT imaging was performed. Quantitative gated imaging was also performed to evaluate left ventricular wall motion, and estimate left ventricular ejection fraction. COMPARISON:  Nuclear medicine cardiac stress exam 06/04/2014 FINDINGS: Perfusion: No decreased activity in the left ventricle on stress imaging to suggest reversible ischemia or infarction. Wall Motion: There is mild global hypokinesis without focal wall motion abnormality. Left Ventricular Ejection Fraction: 40 % End diastolic volume 70 ml End systolic volume 46 ml IMPRESSION: 1. No reversible ischemia or infarction. 2. Mild global hypokinesia without focal wall motion abnormality. 3. Left ventricular ejection fraction 40% 4. Intermediate-risk stress test findings*. *2012 Appropriate Use Criteria for Coronary Revascularization Focused Update: J Am Coll Cardiol. [id number]. http://content.airportbarriers.com.aspx?articleid=1201161 Electronically Signed   By: San Morelle M.D.   On: 09/08/2015 11:00     Disposition   Pt is being discharged home today in good condition.  Follow-up Plans & Appointments    Follow-up Information    Follow up with [facility].   Specialty:  Cardiology   Why:  Please call the office if needing to be seen prior to your annual appointment.   Contact information:   [address] [place] [phone]     Discharge Instructions    Diet - low sodium heart healthy    Complete by:  As directed      Increase activity slowly    Complete by:  As directed            Discharge Medications   Current Discharge Medication List    CONTINUE these medications which have NOT CHANGED   Details  acetaminophen (TYLENOL) 650 MG CR tablet Take 650 mg by mouth every 12 (twelve) hours as needed for pain.    ALPRAZolam (XANAX) 0.5 MG tablet Take 0.5 mg by mouth every 12 (twelve) hours as needed for anxiety or sleep.     aspirin 81 MG chewable tablet Chew 81 mg by mouth daily.    atorvastatin (LIPITOR) 40 MG tablet Take 40 mg by mouth daily.    donepezil (ARICEPT) 10 MG tablet Take 10 mg by mouth at bedtime.    isosorbide mononitrate (IMDUR) 30 MG 24 hr tablet Take 30 mg by mouth daily.    meclizine (ANTIVERT) 25 MG tablet Take 1 tablet (25 mg total) by mouth 3 (three) times daily as needed for dizziness. Qty: 20 tablet, Refills: 0    memantine (NAMENDA XR)  28 MG CP24 24 hr capsule Take 28 mg by mouth daily.    metoprolol (TOPROL-XL) 50 MG 24 hr tablet Take 50 mg by mouth daily.      Multiple Vitamin (MULTIVITAMIN WITH MINERALS) TABS tablet Take 1 tablet by mouth daily.    triamterene-hydrochlorothiazide (MAXZIDE-25) 37.5-25 MG per tablet Take 1 tablet by mouth daily.    VASCEPA 1 g CAPS Take 2 capsules by mouth 2 (two) times daily. Refills: 10    nitroGLYCERIN (NITROSTAT) 0.4 MG SL tablet Place 1 tablet (0.4 mg total) under the tongue every 5 (five) minutes x 3 doses as needed for chest pain (if needed for severe chest pain or pressure). Qty: 25 tablet, Refills: 2      STOP taking these medications     acetaminophen (TYLENOL) 325 MG tablet            Allergies Allergies  Allergen Reactions  . Codeine Other (See Comments)    Anxiety and nervous feelings   . Crestor [Rosuvastatin Calcium] Other (See Comments)    Muscle pains  in knees     Outstanding Labs/Studies   None  Duration of Discharge Encounter   Greater than 30 minutes including physician time.  Signed, Erma Heritage, PA-C 09/08/2015, 3:17 PM

## 2015-09-08 NOTE — Progress Notes (Signed)
Report called to [facility].  Potassium PO given per order.  Instructions reviewed with patient and son, HCPoA.  Son to drive Pt and wife back to facility.

## 2015-09-08 NOTE — Care Management Obs Status (Signed)
[place] NOTIFICATION   Patient Details  Name: [name] MRN: [id number] Date of Birth: March 24, 1933   Medicare Observation Status Notification Given:  Yes    Erenest Rasher, RN 09/08/2015, 2:08 PM

## 2015-09-08 NOTE — Progress Notes (Signed)
  Seen in Nuclear Medicine for 1-day NST. Official Read pending by [facility] Radiology.  Signed, Erma Heritage, PA-C 09/08/2015, 9:30 AM Pager: [phone]

## 2015-09-08 NOTE — Progress Notes (Signed)
Patient will discharge to [facility] Anticipated discharge date:3/10 Family notified: at bedside Transportation by family Report #: [phone] Levada Dy)  [place] signing off.  Domenica Reamer, [place] Social Worker [phone]

## 2015-09-08 NOTE — Progress Notes (Signed)
Patient ID: [name] Height, male   DOB: September 05, 1932, 82 y.o.   MRN: [id number]    Subjective:  Denies SSCP, palpitations or Dyspnea Dementia  Having nuclear scan   Objective:  Filed Vitals:   09/07/15 1640 09/07/15 2041 09/08/15 0024 09/08/15 0634  BP: 176/81 137/74 126/57 120/55  Pulse: 71 72 76 65  Temp: 97.6 F (36.4 C) 98.1 F (36.7 C)  98.1 F (36.7 C)  TempSrc: Oral Oral  Oral  Resp: 18 18  17   Weight:    85.5 kg (188 lb 7.9 oz)  SpO2: 96% 95% 97% 95%    Intake/Output from previous day:  Intake/Output Summary (Last 24 hours) at 09/08/15 0830 Last data filed at 09/07/15 2042  Gross per 24 hour  Intake      0 ml  Output    350 ml  Net   -350 ml    Physical Exam: Affect appropriate Elderly white male  HEENT: normal Neck supple with no adenopathy JVP normal no bruits no thyromegaly Lungs clear with no wheezing and good diaphragmatic motion Heart:  S1/S2 no murmur, no rub, gallop or click PMI normal Abdomen: benighn, BS positve, no tenderness, no AAA no bruit.  No HSM or HJR Distal pulses intact with no bruits No edema Neuro non-focal Skin warm and dry No muscular weakness   Lab Results: Basic Metabolic Panel:  Recent Labs  09/07/15 0941 09/08/15 0207  NA 144 140  K 3.3* 2.9*  CL 104 101  CO2 29 26  GLUCOSE 119* 142*  BUN 14 20  CREATININE 1.15 1.08  CALCIUM 9.4 9.1   CBC:  Recent Labs  09/07/15 0941  WBC 6.2  HGB 14.3  HCT 41.4  MCV 92.2  PLT 108*   Cardiac Enzymes:  Recent Labs  09/07/15 1606 09/07/15 2100 09/08/15 0207  TROPONINI <0.03 <0.03 <0.03    Imaging: Dg Chest 2 View  09/07/2015  CLINICAL DATA:  Left-sided chest pain today. No acute injury. History of hypertension and ventricular arrhythmia. EXAM: CHEST  2 VIEW COMPARISON:  12/10/2014 and 10/20/2014. FINDINGS: The heart size is at the upper limits of normal for AP technique. The lungs appear stable with probable mild chronic basilar scarring. There is no airspace  disease, edema or significant pleural effusion. The bones appear unchanged with a chronic lower thoracic compression deformity. IMPRESSION: Stable mild chronic lung disease.  No acute cardiopulmonary process. Electronically Signed   By: Richardean Sale M.D.   On: 09/07/2015 09:34    Cardiac Studies:  ECG:  NSR no acute ST changes    Telemetry:  NSR  09/08/2015   Echo:   Medications:   . aspirin  324 mg Oral Once  . aspirin  81 mg Oral Daily  . atorvastatin  40 mg Oral Daily  . donepezil  10 mg Oral QHS  . enoxaparin (LOVENOX) injection  40 mg Subcutaneous Q24H  . isosorbide mononitrate  30 mg Oral Daily  . memantine  28 mg Oral Daily  . metoprolol succinate  50 mg Oral Daily  . multivitamin with minerals  1 tablet Oral Daily  . omega-3 acid ethyl esters  1 g Oral BID       Assessment/Plan:  1. Chest Pain/ Mild CAD - history of mild non-obstructive CAD by cath in 2003. Last Carlton Adam was in 05/2014 showed no evidence of ischemia. - the patient is unable to provide much history surrounding his events, but the wife is able to provide history saying the pain  awoke him from sleep and he had a previous episode of chest pain this past weekend. - initial troponin negative and EKG without acute ischemic changes. - Lexiscan myovue today d/c if negative  - continue ASA, statin, Imdur, and BB.   2. HTN - BP has been 130/60 - 143/77 while admitted. - continue PTA Imdur and Toprol-XL. Maxzide currently held to avoid hypotension. Can likely resume prior to discharge.  3. HLD - continue statin therapy.  4. Dementia - the patient is a poor historian and most history is provided by his wife and son - continue PTA Aricept and Namenda.   Jenkins Rouge 09/08/2015, 8:30 AM

## 2015-09-08 NOTE — Care Management Note (Addendum)
Case Management Note  Patient Details  Name: [name] MRN: [id number] Date of Birth: [birth year]  Subjective/Objective:     Chest pain               Action/Plan: Discharge Planning: NCM spoke to pt and wife, and son at bedside. Pt lives at [facility]. Son states he has RW, and 3n1 at facility. States they are satisfied with his care at ALF. Waiting final recommendations for home. CSW following for ALF.   Deirdre Evener MD  Expected Discharge Date:  09/08/2015              Expected Discharge Plan:  Assisted Living / Rest Home  In-House Referral:  Clinical Social Work  Discharge planning Services  CM Consult  Post Acute Care Choice:  NA Choice offered to:  NA  DME Arranged:  N/A DME Agency:  NA  HH Arranged:  NA HH Agency:  NA  Status of Service:  Completed, signed off  Medicare Important Message Given:    Date Medicare IM Given:    Medicare IM give by:    Date Additional Medicare IM Given:    Additional Medicare Important Message give by:     If discussed at [place] of Stay Meetings, dates discussed:    Additional Comments:  Erenest Rasher, RN 09/08/2015, 2:09 PM

## 2015-11-07 ENCOUNTER — Other Ambulatory Visit: Payer: Self-pay | Admitting: Family Medicine

## 2015-11-07 ENCOUNTER — Ambulatory Visit
Admission: RE | Admit: 2015-11-07 | Discharge: 2015-11-07 | Disposition: A | Discharge status: Home / Self Care | Payer: Medicare Other | Source: Ambulatory Visit | Attending: Family Medicine | Admitting: Family Medicine

## 2015-11-07 DIAGNOSIS — M47816 Spondylosis without myelopathy or radiculopathy, lumbar region: Secondary | ICD-10-CM | POA: Diagnosis not present

## 2015-11-07 DIAGNOSIS — M545 Low back pain: Secondary | ICD-10-CM | POA: Diagnosis not present

## 2016-03-20 DIAGNOSIS — Z23 Encounter for immunization: Secondary | ICD-10-CM | POA: Diagnosis not present

## 2016-05-24 DIAGNOSIS — R2681 Unsteadiness on feet: Secondary | ICD-10-CM | POA: Diagnosis not present

## 2016-05-24 DIAGNOSIS — M6281 Muscle weakness (generalized): Secondary | ICD-10-CM | POA: Diagnosis not present

## 2016-05-24 DIAGNOSIS — R296 Repeated falls: Secondary | ICD-10-CM | POA: Diagnosis not present

## 2016-05-24 DIAGNOSIS — R488 Other symbolic dysfunctions: Secondary | ICD-10-CM | POA: Diagnosis not present

## 2016-05-28 DIAGNOSIS — R2681 Unsteadiness on feet: Secondary | ICD-10-CM | POA: Diagnosis not present

## 2016-05-28 DIAGNOSIS — R488 Other symbolic dysfunctions: Secondary | ICD-10-CM | POA: Diagnosis not present

## 2016-05-28 DIAGNOSIS — R296 Repeated falls: Secondary | ICD-10-CM | POA: Diagnosis not present

## 2016-05-28 DIAGNOSIS — M6281 Muscle weakness (generalized): Secondary | ICD-10-CM | POA: Diagnosis not present

## 2016-05-29 DIAGNOSIS — R2681 Unsteadiness on feet: Secondary | ICD-10-CM | POA: Diagnosis not present

## 2016-05-29 DIAGNOSIS — R296 Repeated falls: Secondary | ICD-10-CM | POA: Diagnosis not present

## 2016-05-29 DIAGNOSIS — M6281 Muscle weakness (generalized): Secondary | ICD-10-CM | POA: Diagnosis not present

## 2016-05-29 DIAGNOSIS — R488 Other symbolic dysfunctions: Secondary | ICD-10-CM | POA: Diagnosis not present

## 2016-05-30 DIAGNOSIS — R488 Other symbolic dysfunctions: Secondary | ICD-10-CM | POA: Diagnosis not present

## 2016-05-30 DIAGNOSIS — R296 Repeated falls: Secondary | ICD-10-CM | POA: Diagnosis not present

## 2016-05-30 DIAGNOSIS — R2681 Unsteadiness on feet: Secondary | ICD-10-CM | POA: Diagnosis not present

## 2016-05-30 DIAGNOSIS — M6281 Muscle weakness (generalized): Secondary | ICD-10-CM | POA: Diagnosis not present

## 2016-05-31 DIAGNOSIS — R2681 Unsteadiness on feet: Secondary | ICD-10-CM | POA: Diagnosis not present

## 2016-05-31 DIAGNOSIS — R488 Other symbolic dysfunctions: Secondary | ICD-10-CM | POA: Diagnosis not present

## 2016-05-31 DIAGNOSIS — R296 Repeated falls: Secondary | ICD-10-CM | POA: Diagnosis not present

## 2016-05-31 DIAGNOSIS — M6281 Muscle weakness (generalized): Secondary | ICD-10-CM | POA: Diagnosis not present

## 2016-06-04 DIAGNOSIS — R488 Other symbolic dysfunctions: Secondary | ICD-10-CM | POA: Diagnosis not present

## 2016-06-04 DIAGNOSIS — M6281 Muscle weakness (generalized): Secondary | ICD-10-CM | POA: Diagnosis not present

## 2016-06-04 DIAGNOSIS — R2681 Unsteadiness on feet: Secondary | ICD-10-CM | POA: Diagnosis not present

## 2016-06-04 DIAGNOSIS — R296 Repeated falls: Secondary | ICD-10-CM | POA: Diagnosis not present

## 2016-06-05 DIAGNOSIS — R488 Other symbolic dysfunctions: Secondary | ICD-10-CM | POA: Diagnosis not present

## 2016-06-05 DIAGNOSIS — R2681 Unsteadiness on feet: Secondary | ICD-10-CM | POA: Diagnosis not present

## 2016-06-05 DIAGNOSIS — R296 Repeated falls: Secondary | ICD-10-CM | POA: Diagnosis not present

## 2016-06-05 DIAGNOSIS — M6281 Muscle weakness (generalized): Secondary | ICD-10-CM | POA: Diagnosis not present

## 2016-06-06 DIAGNOSIS — R488 Other symbolic dysfunctions: Secondary | ICD-10-CM | POA: Diagnosis not present

## 2016-06-06 DIAGNOSIS — R296 Repeated falls: Secondary | ICD-10-CM | POA: Diagnosis not present

## 2016-06-06 DIAGNOSIS — R2681 Unsteadiness on feet: Secondary | ICD-10-CM | POA: Diagnosis not present

## 2016-06-06 DIAGNOSIS — M6281 Muscle weakness (generalized): Secondary | ICD-10-CM | POA: Diagnosis not present

## 2016-06-07 DIAGNOSIS — R488 Other symbolic dysfunctions: Secondary | ICD-10-CM | POA: Diagnosis not present

## 2016-06-07 DIAGNOSIS — R2681 Unsteadiness on feet: Secondary | ICD-10-CM | POA: Diagnosis not present

## 2016-06-07 DIAGNOSIS — M6281 Muscle weakness (generalized): Secondary | ICD-10-CM | POA: Diagnosis not present

## 2016-06-07 DIAGNOSIS — R296 Repeated falls: Secondary | ICD-10-CM | POA: Diagnosis not present

## 2016-06-10 DIAGNOSIS — R296 Repeated falls: Secondary | ICD-10-CM | POA: Diagnosis not present

## 2016-06-10 DIAGNOSIS — R488 Other symbolic dysfunctions: Secondary | ICD-10-CM | POA: Diagnosis not present

## 2016-06-10 DIAGNOSIS — M6281 Muscle weakness (generalized): Secondary | ICD-10-CM | POA: Diagnosis not present

## 2016-06-10 DIAGNOSIS — R2681 Unsteadiness on feet: Secondary | ICD-10-CM | POA: Diagnosis not present

## 2016-06-11 DIAGNOSIS — M6281 Muscle weakness (generalized): Secondary | ICD-10-CM | POA: Diagnosis not present

## 2016-06-11 DIAGNOSIS — R2681 Unsteadiness on feet: Secondary | ICD-10-CM | POA: Diagnosis not present

## 2016-06-11 DIAGNOSIS — R488 Other symbolic dysfunctions: Secondary | ICD-10-CM | POA: Diagnosis not present

## 2016-06-11 DIAGNOSIS — R296 Repeated falls: Secondary | ICD-10-CM | POA: Diagnosis not present

## 2016-06-12 DIAGNOSIS — R296 Repeated falls: Secondary | ICD-10-CM | POA: Diagnosis not present

## 2016-06-12 DIAGNOSIS — R488 Other symbolic dysfunctions: Secondary | ICD-10-CM | POA: Diagnosis not present

## 2016-06-12 DIAGNOSIS — R2681 Unsteadiness on feet: Secondary | ICD-10-CM | POA: Diagnosis not present

## 2016-06-12 DIAGNOSIS — M6281 Muscle weakness (generalized): Secondary | ICD-10-CM | POA: Diagnosis not present

## 2016-06-13 DIAGNOSIS — R2681 Unsteadiness on feet: Secondary | ICD-10-CM | POA: Diagnosis not present

## 2016-06-13 DIAGNOSIS — R296 Repeated falls: Secondary | ICD-10-CM | POA: Diagnosis not present

## 2016-06-13 DIAGNOSIS — R488 Other symbolic dysfunctions: Secondary | ICD-10-CM | POA: Diagnosis not present

## 2016-06-13 DIAGNOSIS — M6281 Muscle weakness (generalized): Secondary | ICD-10-CM | POA: Diagnosis not present

## 2016-06-14 DIAGNOSIS — R488 Other symbolic dysfunctions: Secondary | ICD-10-CM | POA: Diagnosis not present

## 2016-06-14 DIAGNOSIS — R296 Repeated falls: Secondary | ICD-10-CM | POA: Diagnosis not present

## 2016-06-14 DIAGNOSIS — M6281 Muscle weakness (generalized): Secondary | ICD-10-CM | POA: Diagnosis not present

## 2016-06-14 DIAGNOSIS — R2681 Unsteadiness on feet: Secondary | ICD-10-CM | POA: Diagnosis not present

## 2016-06-18 DIAGNOSIS — M6281 Muscle weakness (generalized): Secondary | ICD-10-CM | POA: Diagnosis not present

## 2016-06-18 DIAGNOSIS — R488 Other symbolic dysfunctions: Secondary | ICD-10-CM | POA: Diagnosis not present

## 2016-06-18 DIAGNOSIS — R296 Repeated falls: Secondary | ICD-10-CM | POA: Diagnosis not present

## 2016-06-18 DIAGNOSIS — R2681 Unsteadiness on feet: Secondary | ICD-10-CM | POA: Diagnosis not present

## 2016-06-19 DIAGNOSIS — R296 Repeated falls: Secondary | ICD-10-CM | POA: Diagnosis not present

## 2016-06-19 DIAGNOSIS — R488 Other symbolic dysfunctions: Secondary | ICD-10-CM | POA: Diagnosis not present

## 2016-06-19 DIAGNOSIS — M6281 Muscle weakness (generalized): Secondary | ICD-10-CM | POA: Diagnosis not present

## 2016-06-19 DIAGNOSIS — R2681 Unsteadiness on feet: Secondary | ICD-10-CM | POA: Diagnosis not present

## 2016-06-20 DIAGNOSIS — R488 Other symbolic dysfunctions: Secondary | ICD-10-CM | POA: Diagnosis not present

## 2016-06-20 DIAGNOSIS — M6281 Muscle weakness (generalized): Secondary | ICD-10-CM | POA: Diagnosis not present

## 2016-06-20 DIAGNOSIS — R296 Repeated falls: Secondary | ICD-10-CM | POA: Diagnosis not present

## 2016-06-20 DIAGNOSIS — R2681 Unsteadiness on feet: Secondary | ICD-10-CM | POA: Diagnosis not present

## 2016-06-25 DIAGNOSIS — R2681 Unsteadiness on feet: Secondary | ICD-10-CM | POA: Diagnosis not present

## 2016-06-25 DIAGNOSIS — M6281 Muscle weakness (generalized): Secondary | ICD-10-CM | POA: Diagnosis not present

## 2016-06-25 DIAGNOSIS — R488 Other symbolic dysfunctions: Secondary | ICD-10-CM | POA: Diagnosis not present

## 2016-06-25 DIAGNOSIS — R296 Repeated falls: Secondary | ICD-10-CM | POA: Diagnosis not present

## 2016-06-26 DIAGNOSIS — R488 Other symbolic dysfunctions: Secondary | ICD-10-CM | POA: Diagnosis not present

## 2016-06-26 DIAGNOSIS — R296 Repeated falls: Secondary | ICD-10-CM | POA: Diagnosis not present

## 2016-06-26 DIAGNOSIS — M6281 Muscle weakness (generalized): Secondary | ICD-10-CM | POA: Diagnosis not present

## 2016-06-26 DIAGNOSIS — R2681 Unsteadiness on feet: Secondary | ICD-10-CM | POA: Diagnosis not present

## 2016-06-27 DIAGNOSIS — M6281 Muscle weakness (generalized): Secondary | ICD-10-CM | POA: Diagnosis not present

## 2016-06-27 DIAGNOSIS — R488 Other symbolic dysfunctions: Secondary | ICD-10-CM | POA: Diagnosis not present

## 2016-06-27 DIAGNOSIS — R2681 Unsteadiness on feet: Secondary | ICD-10-CM | POA: Diagnosis not present

## 2016-06-27 DIAGNOSIS — R296 Repeated falls: Secondary | ICD-10-CM | POA: Diagnosis not present

## 2016-07-03 DIAGNOSIS — R296 Repeated falls: Secondary | ICD-10-CM | POA: Diagnosis not present

## 2016-07-03 DIAGNOSIS — M6281 Muscle weakness (generalized): Secondary | ICD-10-CM | POA: Diagnosis not present

## 2016-07-04 DIAGNOSIS — R296 Repeated falls: Secondary | ICD-10-CM | POA: Diagnosis not present

## 2016-07-04 DIAGNOSIS — M6281 Muscle weakness (generalized): Secondary | ICD-10-CM | POA: Diagnosis not present

## 2016-07-09 DIAGNOSIS — M6281 Muscle weakness (generalized): Secondary | ICD-10-CM | POA: Diagnosis not present

## 2016-07-09 DIAGNOSIS — R296 Repeated falls: Secondary | ICD-10-CM | POA: Diagnosis not present

## 2016-07-10 DIAGNOSIS — M6281 Muscle weakness (generalized): Secondary | ICD-10-CM | POA: Diagnosis not present

## 2016-07-10 DIAGNOSIS — R296 Repeated falls: Secondary | ICD-10-CM | POA: Diagnosis not present

## 2016-07-11 DIAGNOSIS — M6281 Muscle weakness (generalized): Secondary | ICD-10-CM | POA: Diagnosis not present

## 2016-07-11 DIAGNOSIS — R296 Repeated falls: Secondary | ICD-10-CM | POA: Diagnosis not present

## 2016-07-17 ENCOUNTER — Emergency Department (HOSPITAL_COMMUNITY): Payer: Medicare Other

## 2016-07-17 ENCOUNTER — Encounter (HOSPITAL_COMMUNITY): Payer: Self-pay

## 2016-07-17 ENCOUNTER — Inpatient Hospital Stay (HOSPITAL_COMMUNITY)
Admission: EM | Admit: 2016-07-17 | Discharge: 2016-07-19 | DRG: 193 | Disposition: A | Discharge status: Home / Self Care | Payer: Medicare Other | Attending: Internal Medicine | Admitting: Internal Medicine

## 2016-07-17 DIAGNOSIS — Z885 Allergy status to narcotic agent status: Secondary | ICD-10-CM

## 2016-07-17 DIAGNOSIS — Z79899 Other long term (current) drug therapy: Secondary | ICD-10-CM | POA: Diagnosis not present

## 2016-07-17 DIAGNOSIS — J101 Influenza due to other identified influenza virus with other respiratory manifestations: Secondary | ICD-10-CM | POA: Diagnosis not present

## 2016-07-17 DIAGNOSIS — J849 Interstitial pulmonary disease, unspecified: Secondary | ICD-10-CM | POA: Diagnosis present

## 2016-07-17 DIAGNOSIS — T380X5A Adverse effect of glucocorticoids and synthetic analogues, initial encounter: Secondary | ICD-10-CM | POA: Diagnosis not present

## 2016-07-17 DIAGNOSIS — F0390 Unspecified dementia without behavioral disturbance: Secondary | ICD-10-CM | POA: Diagnosis not present

## 2016-07-17 DIAGNOSIS — I10 Essential (primary) hypertension: Secondary | ICD-10-CM | POA: Diagnosis present

## 2016-07-17 DIAGNOSIS — R0981 Nasal congestion: Secondary | ICD-10-CM | POA: Diagnosis not present

## 2016-07-17 DIAGNOSIS — E785 Hyperlipidemia, unspecified: Secondary | ICD-10-CM | POA: Diagnosis present

## 2016-07-17 DIAGNOSIS — J111 Influenza due to unidentified influenza virus with other respiratory manifestations: Secondary | ICD-10-CM | POA: Diagnosis not present

## 2016-07-17 DIAGNOSIS — R066 Hiccough: Secondary | ICD-10-CM | POA: Diagnosis not present

## 2016-07-17 DIAGNOSIS — R739 Hyperglycemia, unspecified: Secondary | ICD-10-CM | POA: Diagnosis present

## 2016-07-17 DIAGNOSIS — E876 Hypokalemia: Secondary | ICD-10-CM | POA: Diagnosis present

## 2016-07-17 DIAGNOSIS — I248 Other forms of acute ischemic heart disease: Secondary | ICD-10-CM | POA: Diagnosis present

## 2016-07-17 DIAGNOSIS — I119 Hypertensive heart disease without heart failure: Secondary | ICD-10-CM | POA: Diagnosis not present

## 2016-07-17 DIAGNOSIS — J9601 Acute respiratory failure with hypoxia: Secondary | ICD-10-CM | POA: Diagnosis present

## 2016-07-17 DIAGNOSIS — R0902 Hypoxemia: Secondary | ICD-10-CM | POA: Diagnosis present

## 2016-07-17 DIAGNOSIS — E869 Volume depletion, unspecified: Secondary | ICD-10-CM | POA: Diagnosis present

## 2016-07-17 DIAGNOSIS — Z87891 Personal history of nicotine dependence: Secondary | ICD-10-CM

## 2016-07-17 DIAGNOSIS — N179 Acute kidney failure, unspecified: Secondary | ICD-10-CM

## 2016-07-17 DIAGNOSIS — Z7982 Long term (current) use of aspirin: Secondary | ICD-10-CM

## 2016-07-17 DIAGNOSIS — J9801 Acute bronchospasm: Secondary | ICD-10-CM | POA: Diagnosis present

## 2016-07-17 DIAGNOSIS — I2510 Atherosclerotic heart disease of native coronary artery without angina pectoris: Secondary | ICD-10-CM | POA: Diagnosis present

## 2016-07-17 DIAGNOSIS — R509 Fever, unspecified: Secondary | ICD-10-CM | POA: Diagnosis not present

## 2016-07-17 DIAGNOSIS — Z20828 Contact with and (suspected) exposure to other viral communicable diseases: Secondary | ICD-10-CM

## 2016-07-17 LAB — CBC
HEMATOCRIT: 42.3 % (ref 39.0–52.0)
Hemoglobin: 14.7 g/dL (ref 13.0–17.0)
MCH: 31.8 pg (ref 26.0–34.0)
MCHC: 34.8 g/dL (ref 30.0–36.0)
MCV: 91.6 fL (ref 78.0–100.0)
Platelets: 124 10*3/uL — ABNORMAL LOW (ref 150–400)
RBC: 4.62 MIL/uL (ref 4.22–5.81)
RDW: 14.1 % (ref 11.5–15.5)
WBC: 10.2 10*3/uL (ref 4.0–10.5)

## 2016-07-17 LAB — TROPONIN I: Troponin I: 0.09 ng/mL (ref <0.03)

## 2016-07-17 LAB — BASIC METABOLIC PANEL
Anion gap: 12 (ref 5–15)
BUN: 41 mg/dL — ABNORMAL HIGH (ref 6–20)
CHLORIDE: 100 mmol/L — AB (ref 101–111)
CO2: 28 mmol/L (ref 22–32)
Calcium: 9.5 mg/dL (ref 8.9–10.3)
Creatinine, Ser: 1.59 mg/dL — ABNORMAL HIGH (ref 0.61–1.24)
GFR calc Af Amer: 45 mL/min — ABNORMAL LOW (ref >60)
GFR calc non Af Amer: 39 mL/min — ABNORMAL LOW (ref >60)
GLUCOSE: 132 mg/dL — AB (ref 65–99)
POTASSIUM: 3.6 mmol/L (ref 3.5–5.1)
Sodium: 140 mmol/L (ref 135–145)

## 2016-07-17 LAB — INFLUENZA PANEL BY PCR (TYPE A & B)
Influenza A By PCR: POSITIVE — AB
Influenza B By PCR: NEGATIVE

## 2016-07-17 LAB — GLUCOSE, CAPILLARY: Glucose-Capillary: 110 mg/dL — ABNORMAL HIGH (ref 65–99)

## 2016-07-17 MED ORDER — ICOSAPENT ETHYL 1 G PO CAPS: 2.0000 | ORAL_CAPSULE | Freq: Two times a day (BID) | ORAL | Status: DC

## 2016-07-17 MED ORDER — OSELTAMIVIR PHOSPHATE 30 MG PO CAPS
30.0000 mg | ORAL_CAPSULE | Freq: Two times a day (BID) | ORAL | Status: DC
  Administered 2016-07-17 – 2016-07-19 (×4): 30 mg via ORAL
  Filled 2016-07-17 (×5): qty 1

## 2016-07-17 MED ORDER — IPRATROPIUM-ALBUTEROL 0.5-2.5 (3) MG/3ML IN SOLN
3.0000 mL | Freq: Once | RESPIRATORY_TRACT | Status: AC
  Administered 2016-07-17: 3 mL via RESPIRATORY_TRACT
  Filled 2016-07-17: qty 3

## 2016-07-17 MED ORDER — ENOXAPARIN SODIUM 40 MG/0.4ML ~~LOC~~ SOLN
40.0000 mg | SUBCUTANEOUS | Status: DC
  Administered 2016-07-18 (×2): 40 mg via SUBCUTANEOUS
  Filled 2016-07-17 (×2): qty 0.4

## 2016-07-17 MED ORDER — ATORVASTATIN CALCIUM 40 MG PO TABS
40.0000 mg | ORAL_TABLET | Freq: Every day | ORAL | Status: DC
  Administered 2016-07-18 – 2016-07-19 (×2): 40 mg via ORAL
  Filled 2016-07-17 (×2): qty 1

## 2016-07-17 MED ORDER — ONDANSETRON HCL 4 MG PO TABS: 4.0000 mg | ORAL_TABLET | Freq: Four times a day (QID) | ORAL | Status: DC | PRN

## 2016-07-17 MED ORDER — METOPROLOL SUCCINATE ER 50 MG PO TB24
50.0000 mg | ORAL_TABLET | Freq: Every day | ORAL | Status: DC
  Administered 2016-07-18 – 2016-07-19 (×2): 50 mg via ORAL
  Filled 2016-07-17 (×2): qty 1

## 2016-07-17 MED ORDER — SODIUM CHLORIDE 0.9 % IV SOLN
INTRAVENOUS | Status: DC
  Administered 2016-07-17 – 2016-07-19 (×3): via INTRAVENOUS

## 2016-07-17 MED ORDER — SODIUM CHLORIDE 0.9 % IV SOLN
INTRAVENOUS | Status: DC
  Filled 2016-07-17: qty 1000

## 2016-07-17 MED ORDER — MECLIZINE HCL 25 MG PO TABS: 25.0000 mg | ORAL_TABLET | Freq: Three times a day (TID) | ORAL | Status: DC | PRN

## 2016-07-17 MED ORDER — MEMANTINE HCL ER 28 MG PO CP24
28.0000 mg | ORAL_CAPSULE | Freq: Every day | ORAL | Status: DC
  Administered 2016-07-18 – 2016-07-19 (×2): 28 mg via ORAL
  Filled 2016-07-17 (×2): qty 1

## 2016-07-17 MED ORDER — ALPRAZOLAM 0.5 MG PO TABS
0.5000 mg | ORAL_TABLET | Freq: Two times a day (BID) | ORAL | Status: DC | PRN
  Administered 2016-07-18 (×2): 0.5 mg via ORAL
  Filled 2016-07-17 (×2): qty 1

## 2016-07-17 MED ORDER — ACETAMINOPHEN 650 MG RE SUPP: 650.0000 mg | Freq: Four times a day (QID) | RECTAL | Status: DC | PRN

## 2016-07-17 MED ORDER — ASPIRIN 81 MG PO CHEW
81.0000 mg | CHEWABLE_TABLET | Freq: Every day | ORAL | Status: DC
  Administered 2016-07-18 – 2016-07-19 (×2): 81 mg via ORAL
  Filled 2016-07-17 (×2): qty 1

## 2016-07-17 MED ORDER — ADULT MULTIVITAMIN W/MINERALS CH
1.0000 | ORAL_TABLET | Freq: Every day | ORAL | Status: DC
  Administered 2016-07-18 – 2016-07-19 (×2): 1 via ORAL
  Filled 2016-07-17 (×2): qty 1

## 2016-07-17 MED ORDER — OMEGA-3-ACID ETHYL ESTERS 1 G PO CAPS
1.0000 g | ORAL_CAPSULE | Freq: Two times a day (BID) | ORAL | Status: DC
  Administered 2016-07-18 – 2016-07-19 (×4): 1 g via ORAL
  Filled 2016-07-17 (×4): qty 1

## 2016-07-17 MED ORDER — IPRATROPIUM-ALBUTEROL 0.5-2.5 (3) MG/3ML IN SOLN
3.0000 mL | Freq: Four times a day (QID) | RESPIRATORY_TRACT | Status: DC
  Administered 2016-07-17: 3 mL via RESPIRATORY_TRACT
  Filled 2016-07-17: qty 3

## 2016-07-17 MED ORDER — ACETAMINOPHEN 325 MG PO TABS: 650.0000 mg | ORAL_TABLET | Freq: Four times a day (QID) | ORAL | Status: DC | PRN

## 2016-07-17 MED ORDER — ONDANSETRON HCL 4 MG/2ML IJ SOLN: 4.0000 mg | Freq: Four times a day (QID) | INTRAMUSCULAR | Status: DC | PRN

## 2016-07-17 MED ORDER — OSELTAMIVIR PHOSPHATE 30 MG PO CAPS: 30.0000 mg | ORAL_CAPSULE | Freq: Two times a day (BID) | ORAL | Status: DC

## 2016-07-17 MED ORDER — ISOSORBIDE MONONITRATE ER 30 MG PO TB24
30.0000 mg | ORAL_TABLET | Freq: Every day | ORAL | Status: DC
  Administered 2016-07-18 – 2016-07-19 (×2): 30 mg via ORAL
  Filled 2016-07-17 (×2): qty 1

## 2016-07-17 MED ORDER — IPRATROPIUM-ALBUTEROL 0.5-2.5 (3) MG/3ML IN SOLN
3.0000 mL | Freq: Four times a day (QID) | RESPIRATORY_TRACT | Status: DC
  Administered 2016-07-18 – 2016-07-19 (×6): 3 mL via RESPIRATORY_TRACT
  Filled 2016-07-17 (×7): qty 3

## 2016-07-17 MED ORDER — DONEPEZIL HCL 5 MG PO TABS
10.0000 mg | ORAL_TABLET | Freq: Every day | ORAL | Status: DC
  Administered 2016-07-18 (×2): 10 mg via ORAL
  Filled 2016-07-17 (×3): qty 2

## 2016-07-17 MED ORDER — POTASSIUM CHLORIDE IN NACL 20-0.9 MEQ/L-% IV SOLN
INTRAVENOUS | Status: AC
  Administered 2016-07-18: via INTRAVENOUS
  Filled 2016-07-17: qty 1000

## 2016-07-17 NOTE — ED Triage Notes (Signed)
Pt wife in hospital now.  Just dx with flu.  Told by staff member to have patient checked out.  Pt denied all flu like symptoms.  No shortness of breath, fever.  Slight cough.  Pt does however have low oxygen at 88%.  Denies any symptoms.  Does not wear oxygen at home.

## 2016-07-17 NOTE — ED Provider Notes (Signed)
[facility] DEPT Provider Note   CSN: [id number] Arrival date & time: 07/17/16  1417     History   Chief Complaint Chief Complaint  Patient presents with  . Shortness of Breath    HPI [name] is a 83 y.o. male.  HPI Patient presents to the emergency room without any specific complaints.  Patient is not actually sure why he is here. History is obtained primarily from the nursing notes. The patient's wife is in the emergency room as well being evaluated.  She was diagnosed with influenza. According to notes the family members wanted him to get checked out as well. Patient admits to having a cough. He denies any fevers. He denies any chest pain or shortness of breath. He denies any vomiting or diarrhea.  Past Medical History:  Diagnosis Date  . Angina at rest [facility])   . Bruising   . Constipation   . Dementia    on Aricept and Namenda  . Fall   . Forgetfulness   . Hyperlipidemia   . Hypertension   . IHD (ischemic heart disease)    remote cath in 2003 with mild nonobstructive disease. negative nuclear in 2008  . LV dysfunction    EF normal per echo in 2005.   Marland Kitchen PVC's (premature ventricular contractions)   . Ventricular arrhythmia     Patient Active Problem List   Diagnosis Date Noted  . NSVT (nonsustained ventricular tachycardia) ([place]) 06/05/2014  . CAD (coronary artery disease)- mild 2003 06/04/2014  . Chest pain 06/03/2014  . Atherosclerosis of aorta ([place]) 05/03/2013  . PVC (premature ventricular contraction) 05/03/2013  . Chest pain with moderate risk of acute coronary syndrome 08/30/2011  . Hypercholesterolemia with hypertriglyceridemia 01/09/2011  . Chronic ischemic heart disease 01/09/2011  . Benign hypertensive heart disease without heart failure 01/09/2011  . Dementia 01/09/2011    Past Surgical History:  Procedure Laterality Date  . CARDIAC CATHETERIZATION  03/21/2002   MILD GLOBAL HYPOKINESIA. EF 50%  . [place] SURGERY     RIGHT [place]  .  TONSILLECTOMY    . US ECHOCARDIOGRAPHY  2005   Mild AS/AI, Mild MR with a normal EF       Home Medications    Prior to Admission medications   Medication Sig Start Date End Date Taking? Authorizing Provider  acetaminophen (TYLENOL) 650 MG CR tablet Take 650 mg by mouth every 12 (twelve) hours as needed for pain.    Historical Provider, MD  ALPRAZolam Duanne Moron) 0.5 MG tablet Take 0.5 mg by mouth every 12 (twelve) hours as needed for anxiety or sleep.  07/09/11   Historical Provider, MD  aspirin 81 MG chewable tablet Chew 81 mg by mouth daily.    Historical Provider, MD  atorvastatin (LIPITOR) 40 MG tablet Take 40 mg by mouth daily.    Historical Provider, MD  donepezil (ARICEPT) 10 MG tablet Take 10 mg by mouth at bedtime.    Historical Provider, MD  isosorbide mononitrate (IMDUR) 30 MG 24 hr tablet Take 30 mg by mouth daily.    Historical Provider, MD  meclizine (ANTIVERT) 25 MG tablet Take 1 tablet (25 mg total) by mouth 3 (three) times daily as needed for dizziness. 12/10/14   Virgel Manifold, MD  memantine (NAMENDA XR) 28 MG CP24 24 hr capsule Take 28 mg by mouth daily.    Historical Provider, MD  metoprolol (TOPROL-XL) 50 MG 24 hr tablet Take 50 mg by mouth daily.      Historical Provider, MD  Multiple Vitamin (MULTIVITAMIN WITH MINERALS) TABS tablet Take 1 tablet by mouth daily.    Historical Provider, MD  nitroGLYCERIN (NITROSTAT) 0.4 MG SL tablet Place 1 tablet (0.4 mg total) under the tongue every 5 (five) minutes x 3 doses as needed for chest pain (if needed for severe chest pain or pressure). Patient not taking: Reported on 12/10/2014 06/04/14   Erlene Quan, PA-C  triamterene-hydrochlorothiazide (MAXZIDE-25) 37.5-25 MG per tablet Take 1 tablet by mouth daily.    Historical Provider, MD  VASCEPA 1 g CAPS Take 2 capsules by mouth 2 (two) times daily. 08/26/15   Historical Provider, MD    Family History Family History  Problem Relation Age of Onset  . Alzheimer's disease Mother   .  Heart attack Father     Social History Social History  Substance Use Topics  . Smoking status: Former Smoker    Quit date: 12/31/1990  . Smokeless tobacco: Never Used  . Alcohol use No     Allergies   Codeine and Crestor [rosuvastatin calcium]   Review of Systems Review of Systems  All other systems reviewed and are negative.    Physical Exam Updated Vital Signs BP (!) 134/104 (BP Location: Left Arm)   Pulse 89   Temp 98.5 F (36.9 C) (Oral)   Resp 20   SpO2 (!) 86%   Physical Exam  Constitutional: No distress.  Elderly, frail  HENT:  Head: Normocephalic and atraumatic.  Right Ear: External ear normal.  Left Ear: External ear normal.  The patient has the nasal cannula oxygen prongs above his nose towards his eyes.  Eyes: Conjunctivae are normal. Right eye exhibits no discharge. Left eye exhibits no discharge. No scleral icterus.  Neck: Neck supple. No tracheal deviation present.  Cardiovascular: Normal rate, regular rhythm and intact distal pulses.   Pulmonary/Chest: Effort normal and breath sounds normal. No stridor. No respiratory distress. He has no wheezes. He has no rales.  Patient's oxygen saturation is 94%, he is on the supplemental oxygen but it is not going into his nose or mouth  Abdominal: Soft. Bowel sounds are normal. He exhibits no distension. There is no tenderness. There is no rebound and no guarding.  Musculoskeletal: He exhibits no edema or tenderness.  Neurological: He is alert. He has normal strength. He is disoriented. No cranial nerve deficit (no facial droop, extraocular movements intact, no slurred speech) or sensory deficit. He exhibits normal muscle tone. He displays no seizure activity. Coordination normal. GCS eye subscore is 4. GCS verbal subscore is 4. GCS motor subscore is 6.  Skin: Skin is warm and dry. No rash noted.  Psychiatric: He has a normal mood and affect.  Nursing note and vitals reviewed.    ED Treatments / Results   Labs (all labs ordered are listed, but only abnormal results are displayed) Labs Reviewed  CBC - Abnormal; Notable for the following:       Result Value   Platelets 124 (*)    All other components within normal limits  BASIC METABOLIC PANEL - Abnormal; Notable for the following:    Chloride 100 (*)    Glucose, Bld 132 (*)    BUN 41 (*)    Creatinine, Ser 1.59 (*)    GFR calc non Af Amer 39 (*)    GFR calc Af Amer 45 (*)    All other components within normal limits  INFLUENZA PANEL BY PCR (TYPE A & B)     Radiology Dg Chest 2  View  Result Date: 07/17/2016 CLINICAL DATA:  Family member hospitalized with flu. Was told to be evaluated for flu. Low oxygenation at 88%. "To for EXAM: CHEST  2 VIEW COMPARISON:  CXR 09/07/2015 FINDINGS: Heart is normal in size. There is aortic atherosclerosis without aneurysm. Mild interstitial fibrosis noted at the lung bases with minimal atelectasis also likely present. No pneumonic consolidation is noted. There is no pulmonary edema. No suspicious osseous abnormalities chronic. Lower thoracic compression deformity is again seen. IMPRESSION: Mild interstitial fibrosis is suspected at the lung bases characterized by fine interstitial lung markings. No acute pulmonary disease. Aortic atherosclerosis. Electronically Signed   By: Ashley Royalty M.D.   On: 07/17/2016 15:06    Procedures Procedures (including critical care time)  Medications Ordered in ED Medications  0.9 %  sodium chloride infusion ( Intravenous New Bag/Given 07/17/16 1524)  ipratropium-albuterol (DUONEB) 0.5-2.5 (3) MG/3ML nebulizer solution 3 mL (not administered)  oseltamivir (TAMIFLU) capsule 75 mg (not administered)     Initial Impression / Assessment and Plan / ED Course  I have reviewed the triage vital signs and the nursing notes.  Pertinent labs & imaging results that were available during my care of the patient were reviewed by me and considered in my medical decision making (see  chart for details).  Clinical Course as of Jul 17 1640  Wed Jul 17, 2016  1548 02 sat dropped to 88% on RA.  Noted to get more short of breath when walking to the bathroom.  Nasal cannula oxygen restarted  [JK]    Clinical Course User Index [JK] Dorie Rank, MD    Patient presented to the emergency room for evaluation of cough and shortness of breath.  Patient is here with his wife who had a positive influenza test. I suspect the patient has influenza as well. His influenza test is still processing.  Chest x-ray does not show pneumonia but suggests mild pulmonary fibrosis. Patient does not have a history of any issues with his breathing.  Because of his hypoxemi,  I started on supplemental oxygen and will consult the medical service for admission. I will start him on Tamiflu.  Final Clinical Impressions(s) / ED Diagnoses   Final diagnoses:  Hypoxia  Exposure to influenza    New Prescriptions New Prescriptions   No medications on file     Dorie Rank, MD 07/17/16 1642

## 2016-07-17 NOTE — ED Notes (Signed)
Pt desaturated to 88% on RA.

## 2016-07-17 NOTE — H&P (Addendum)
History and Physical  LASHAWN DEW [id number] DOB: [birth year] DOA: 07/17/2016   PCP: Reginia Naas, MD   Patient coming from: Home  Chief Complaint: dyspnea  HPI:  [name] is a 83 y.o. male with medical history of hypertension, hyperlipidemia, coronary artery disease, dementia presented with one-day history of shortness of breath and coughing. The patient is a poor historian secondary to his history of dementia. He resides at [facility].  Apparently, the patient's wife also had recent upper respiratory type symptoms including cough, and shortness of breath. She was also in the emergency department at the same time as this patient. Apparently, per patient's family, she also has influenza. The patient denies any fevers, chills, nausea, vomiting, diarrhea, abdominal pain. He complains of shortness of breath and coughing yellow sputum. There is no diarrhea, hematochezia, melena, dysuria, hematuria.  In the emergency department, the patient was noted to be hypoxic with oxygen saturation 86-87% on room air. With 3 L nasal cannula, oxygen saturation improved to 96-97 percent. The patient was afebrile and hemodynamically stable. BMP showed a serum creatinine of 1.59. Otherwise also was unremarkable. WBC was 10.2 with platelets 124,000. Chest x-ray showed chronic interstitial changes without distinct infiltrate. Influenza A was positive.  Assessment/Plan: Acute respiratory failure with hypoxia -The patient has significant bronchospasm and wheezing -start DuoNebs -may need to consider steroids if no improvement -wean oxygen for sat > 92% -~30 pack year hx tobacco, quit in mid 1980s  Influenza with pulmonary manifestations -start oseltamivir -droplet isolation  Atypical chest pain -EKG -cycle troponins -continue Imdur -continue ASA -continue Lipitor  AKI -secondary to volume depletion -baseline creatinine 0.9 - 1.0 -presenting creatinine 1.59 -hold  Maxzide -IVF x 12 hours  Dementia without behavior disturbance -cotinue Aricept and Namenda  Hypertension -continue metoprolol succinate -d/c Maxzide  Hyperglycemia -check A1C          Past Medical History:  Diagnosis Date  . Angina at rest [facility])   . Bruising   . Constipation   . Dementia    on Aricept and Namenda  . Fall   . Forgetfulness   . Hyperlipidemia   . Hypertension   . IHD (ischemic heart disease)    remote cath in 2003 with mild nonobstructive disease. negative nuclear in 2008  . LV dysfunction    EF normal per echo in 2005.   Marland Kitchen PVC's (premature ventricular contractions)   . Ventricular arrhythmia    Past Surgical History:  Procedure Laterality Date  . CARDIAC CATHETERIZATION  03/21/2002   MILD GLOBAL HYPOKINESIA. EF 50%  . [place] SURGERY     RIGHT [place]  . TONSILLECTOMY    . US ECHOCARDIOGRAPHY  2005   Mild AS/AI, Mild MR with a normal EF   Social History:  reports that he quit smoking about 25 years ago. He has never used smokeless tobacco. He reports that he does not drink alcohol or use drugs.   Family History  Problem Relation Age of Onset  . Alzheimer's disease Mother   . Heart attack Father      Allergies  Allergen Reactions  . Codeine Other (See Comments)    Anxiety and nervous feelings   . Crestor [Rosuvastatin Calcium] Other (See Comments)    Muscle pains in knees     Prior to Admission medications   Medication Sig Start Date End Date Taking? Authorizing Provider  acetaminophen (TYLENOL) 650 MG CR tablet Take 650 mg by mouth every 12 (twelve) hours as  needed for pain.    Historical Provider, MD  ALPRAZolam Duanne Moron) 0.5 MG tablet Take 0.5 mg by mouth every 12 (twelve) hours as needed for anxiety or sleep.  07/09/11   Historical Provider, MD  aspirin 81 MG chewable tablet Chew 81 mg by mouth daily.    Historical Provider, MD  atorvastatin (LIPITOR) 40 MG tablet Take 40 mg by mouth daily.    Historical Provider, MD  donepezil  (ARICEPT) 10 MG tablet Take 10 mg by mouth at bedtime.    Historical Provider, MD  isosorbide mononitrate (IMDUR) 30 MG 24 hr tablet Take 30 mg by mouth daily.    Historical Provider, MD  meclizine (ANTIVERT) 25 MG tablet Take 1 tablet (25 mg total) by mouth 3 (three) times daily as needed for dizziness. 12/10/14   Virgel Manifold, MD  memantine (NAMENDA XR) 28 MG CP24 24 hr capsule Take 28 mg by mouth daily.    Historical Provider, MD  metoprolol (TOPROL-XL) 50 MG 24 hr tablet Take 50 mg by mouth daily.      Historical Provider, MD  Multiple Vitamin (MULTIVITAMIN WITH MINERALS) TABS tablet Take 1 tablet by mouth daily.    Historical Provider, MD  nitroGLYCERIN (NITROSTAT) 0.4 MG SL tablet Place 1 tablet (0.4 mg total) under the tongue every 5 (five) minutes x 3 doses as needed for chest pain (if needed for severe chest pain or pressure). Patient not taking: Reported on 12/10/2014 06/04/14   Erlene Quan, PA-C  triamterene-hydrochlorothiazide (MAXZIDE-25) 37.5-25 MG per tablet Take 1 tablet by mouth daily.    Historical Provider, MD  VASCEPA 1 g CAPS Take 2 capsules by mouth 2 (two) times daily. 08/26/15   Historical Provider, MD    Review of Systems:  Constitutional:  No weight loss, night sweats,  Head&Eyes: No headache.  No vision loss.  No eye pain or scotoma ENT:  No Difficulty swallowing,Tooth/dental problems,Sore throat,  No ear ache, post nasal drip,  Cardio-vascular:  No Orthopnea, PND, swelling in lower extremities,  dizziness, palpitations  GI:  No  abdominal pain, nausea, vomiting, diarrhea, loss of appetite, hematochezia, melena, heartburn, indigestion, Resp:  No No coughing up of blood .No wheezing.No chest wall deformity  Skin:  no rash or lesions.  GU:  no dysuria, change in color of urine, no urgency or frequency. No flank pain.  Musculoskeletal:  No joint pain or swelling. No decreased range of motion. No back pain.  Psych:  No change in mood or affect. No depression or  anxiety. Neurologic: No headache, no dysesthesia, no focal weakness, no vision loss. No syncope  Physical Exam: Vitals:   07/17/16 1435 07/17/16 1643  BP: (!) 134/104 125/69  Pulse: 89 93  Resp: 20 18  Temp: 98.5 F (36.9 C)   TempSrc: Oral   SpO2: (!) 86% 94%   General:  A&O x 2, NAD, nontoxic, pleasant/cooperative Head/Eye: No conjunctival hemorrhage, no icterus, [place]/AT, No nystagmus ENT:  No icterus,  No thrush, good dentition, no pharyngeal exudate Neck:  No masses, no lymphadenpathy, no bruits CV:  RRR, no rub, no gallop, no S3 Lung:  Bibasilar rales. Bilateral expiratory wheeze. Abdomen: soft/NT, +BS, nondistended, no peritoneal signs Ext: No cyanosis, No rashes, No petechiae, No lymphangitis, No edema Neuro: CNII-XII intact, strength 4/5 in bilateral upper and lower extremities, no dysmetria  Labs on Admission:  Basic Metabolic Panel:  Recent Labs Lab 07/17/16 1423  NA 140  K 3.6  CL 100*  CO2 28  GLUCOSE 132*  BUN 41*  CREATININE 1.59*  CALCIUM 9.5   Liver Function Tests: No results for input(s): AST, ALT, ALKPHOS, BILITOT, PROT, ALBUMIN in the last 168 hours. No results for input(s): LIPASE, AMYLASE in the last 168 hours. No results for input(s): AMMONIA in the last 168 hours. CBC:  Recent Labs Lab 07/17/16 1423  WBC 10.2  HGB 14.7  HCT 42.3  MCV 91.6  PLT 124*   Coagulation Profile: No results for input(s): INR, PROTIME in the last 168 hours. Cardiac Enzymes: No results for input(s): CKTOTAL, CKMB, CKMBINDEX, TROPONINI in the last 168 hours. BNP: Invalid input(s): POCBNP CBG: No results for input(s): GLUCAP in the last 168 hours. Urine analysis:    Component Value Date/Time   COLORURINE YELLOW 03/16/2008 1410   APPEARANCEUR CLEAR 03/16/2008 1410   LABSPEC 1.011 03/16/2008 1410   PHURINE 6.5 03/16/2008 1410   GLUCOSEU NEGATIVE 03/16/2008 1410   HGBUR NEGATIVE 03/16/2008 1410   BILIRUBINUR NEGATIVE 03/16/2008 1410   KETONESUR NEGATIVE  03/16/2008 1410   PROTEINUR NEGATIVE 03/16/2008 1410   UROBILINOGEN 0.2 03/16/2008 1410   NITRITE NEGATIVE 03/16/2008 1410   LEUKOCYTESUR  03/16/2008 1410    NEGATIVE MICROSCOPIC NOT DONE ON URINES WITH NEGATIVE PROTEIN, BLOOD, LEUKOCYTES, NITRITE, OR GLUCOSE <1000 mg/dL.   Sepsis Labs: @LABRCNTIP (procalcitonin:4,lacticidven:4) )No results found for this or any previous visit (from the past 240 hour(s)).   Radiological Exams on Admission: Dg Chest 2 View  Result Date: 07/17/2016 CLINICAL DATA:  Family member hospitalized with flu. Was told to be evaluated for flu. Low oxygenation at 88%. "To for EXAM: CHEST  2 VIEW COMPARISON:  CXR 09/07/2015 FINDINGS: Heart is normal in size. There is aortic atherosclerosis without aneurysm. Mild interstitial fibrosis noted at the lung bases with minimal atelectasis also likely present. No pneumonic consolidation is noted. There is no pulmonary edema. No suspicious osseous abnormalities chronic. Lower thoracic compression deformity is again seen. IMPRESSION: Mild interstitial fibrosis is suspected at the lung bases characterized by fine interstitial lung markings. No acute pulmonary disease. Aortic atherosclerosis. Electronically Signed   By: Ashley Royalty M.D.   On: 07/17/2016 15:06    EKG: Independently reviewed. pending    Time spent:60 minutes Code Status:   FULL Family Communication:  Son updated at bedside Disposition Plan: expect 1-2 day hospitalization Consults called: none DVT Prophylaxis: [facility] Lovenox  Roshawna Colclasure, DO  [facility] Pager [phone]  If 7PM-7AM, please contact night-coverage [email or web address] Password [facility] 07/17/2016, 5:32 PM

## 2016-07-18 ENCOUNTER — Inpatient Hospital Stay (HOSPITAL_COMMUNITY): Payer: Medicare Other

## 2016-07-18 LAB — BASIC METABOLIC PANEL
ANION GAP: 10 (ref 5–15)
BUN: 32 mg/dL — ABNORMAL HIGH (ref 6–20)
CALCIUM: 8.6 mg/dL — AB (ref 8.9–10.3)
CHLORIDE: 105 mmol/L (ref 101–111)
CO2: 26 mmol/L (ref 22–32)
Creatinine, Ser: 1.2 mg/dL (ref 0.61–1.24)
GFR calc non Af Amer: 55 mL/min — ABNORMAL LOW (ref >60)
Glucose, Bld: 100 mg/dL — ABNORMAL HIGH (ref 65–99)
Potassium: 3.1 mmol/L — ABNORMAL LOW (ref 3.5–5.1)
SODIUM: 141 mmol/L (ref 135–145)

## 2016-07-18 LAB — TROPONIN I
TROPONIN I: 0.09 ng/mL — AB (ref <0.03)
Troponin I: 0.08 ng/mL (ref <0.03)

## 2016-07-18 LAB — MAGNESIUM: MAGNESIUM: 2.1 mg/dL (ref 1.7–2.4)

## 2016-07-18 MED ORDER — METHYLPREDNISOLONE SODIUM SUCC 125 MG IJ SOLR
60.0000 mg | Freq: Two times a day (BID) | INTRAMUSCULAR | Status: AC
  Administered 2016-07-18: 60 mg via INTRAVENOUS
  Filled 2016-07-18: qty 2

## 2016-07-18 MED ORDER — OSELTAMIVIR PHOSPHATE 30 MG PO CAPS: 30.0000 mg | ORAL_CAPSULE | Freq: Two times a day (BID) | ORAL | 0 refills | Status: DC

## 2016-07-18 MED ORDER — PREDNISONE 20 MG PO TABS
60.0000 mg | ORAL_TABLET | Freq: Every day | ORAL | Status: DC
  Administered 2016-07-19: 60 mg via ORAL
  Filled 2016-07-18: qty 3

## 2016-07-18 MED ORDER — METHYLPREDNISOLONE SODIUM SUCC 125 MG IJ SOLR
60.0000 mg | Freq: Two times a day (BID) | INTRAMUSCULAR | Status: DC
  Administered 2016-07-18: 60 mg via INTRAVENOUS
  Filled 2016-07-18: qty 2

## 2016-07-18 MED ORDER — PREDNISONE 10 MG PO TABS: 60.0000 mg | ORAL_TABLET | Freq: Every day | ORAL | 0 refills | Status: DC

## 2016-07-18 MED ORDER — POTASSIUM CHLORIDE CRYS ER 20 MEQ PO TBCR
40.0000 meq | EXTENDED_RELEASE_TABLET | Freq: Once | ORAL | Status: AC
  Administered 2016-07-18: 40 meq via ORAL
  Filled 2016-07-18: qty 2

## 2016-07-18 MED ORDER — POTASSIUM CHLORIDE CRYS ER 20 MEQ PO TBCR: 40.0000 meq | EXTENDED_RELEASE_TABLET | Freq: Every day | ORAL | Status: DC

## 2016-07-18 NOTE — Clinical Social Work Note (Signed)
Clinical Social Work Assessment  Patient Details  Name: [name] MRN: [id number] Date of Birth: [birth year]  Date of referral:  07/18/16               Reason for consult:  Facility Placement, Discharge Planning                Permission sought to share information with:  Facility Art therapist granted to share information::  Yes, Verbal Permission Granted  Name::        Agency::     Relationship::     Contact Information:     Housing/Transportation Living arrangements for the past 2 months:  [place] of Information:  Adult Children Patient Interpreter Needed:  None Criminal Activity/Legal Involvement Pertinent to Current Situation/Hospitalization:  No - Comment as needed Significant Relationships:  Adult Children, Spouse Lives with:  Facility Resident Do you feel safe going back to the place where you live?  Yes Need for family participation in patient care:  Yes (Comment)  Care giving concerns: No concerns reported at this time.   Social Worker assessment / plan: Pt admitted on 07/17/16 from [place] with Acute respiratory failure with hypoxia. CSW met with pt at bed side to offer support. Pt is unable to assist with d/c planning due to cognitive deficits. CSW spoke with pt's son to assist with d/c planning. Son would like pt to return to [place] when stable for d/c. Pt lives there with his spouse. CSW has contacted ALF and clinicals sent for review. CSW will continue to follow to assist with d/c planning needs.  Employment status:  Retired Forensic scientist:  Medicare PT Recommendations:  Not assessed at this time Information / Referral to community resources:     Patient/Family's Response to care:  Son would like pt to return to ALF at d/c.  Patient/Family's Understanding of and Emotional Response to Diagnosis, Current Treatment, and Prognosis: Son is expecting call from MD for medical update. Pt is  looking forward to seeing his wife. " Have you seen my wife. She's the cutest lady around. " Reassurance provided.  Emotional Assessment Appearance:  Appears stated age Attitude/Demeanor/Rapport:  Other (cooperative) Affect (typically observed):  Appropriate, Pleasant Orientation:  Oriented to Self Alcohol / Substance use:  Not Applicable Psych involvement (Current and /or in the community):  No (Comment)  Discharge Needs  Concerns to be addressed:  Discharge Planning Concerns Readmission within the last 30 days:  No Current discharge risk:  None Barriers to Discharge:  No Barriers Identified   [name]  [phone] 07/18/2016, 12:46 PM

## 2016-07-18 NOTE — Evaluation (Signed)
Physical Therapy Evaluation Patient Details Name: [name] MRN: [id number] DOB: April 01, 1933 Today's Date: 07/18/2016    SATURATION QUALIFICATIONS: (This note is used to comply with regulatory documentation for home oxygen)  Patient Saturations on Room Air at Rest = 88%  Patient Saturations on Room Air while Ambulating = N/A  Patient Saturations on 3 Liters of oxygen while Ambulating = 91%    History of Present Illness  83 yo male admitted with acute respiratory failure, + flu. Hx of dementia HTN, CAD  Clinical Impression  On eval, pt required Min assist for mobility. He walked ~115 feet with a RW. Pt tolerated activity fairly well however he did require [place] O2 for ambulation. Son present during session. D/C plan is for pt to return to ALF and resume HHPT follow up. Will follow and progress activity as tolerated. Recommend daily ambulation in hallway with nursing assistance. O2 sats 94% on 3L O2 at end of session.     Follow Up Recommendations Home health PT;Supervision/Assistance - 24 hour (per son, pt will return to ALF)    Equipment Recommendations  None recommended by PT    Recommendations for Other Services       Precautions / Restrictions Precautions Precautions: Fall Precaution Comments: monitor O2 sats Restrictions Weight Bearing Restrictions: No      Mobility  Bed Mobility Overal bed mobility: Needs Assistance Bed Mobility: Supine to Sit     Supine to sit: Supervision     General bed mobility comments: for safety. cues required  Transfers Overall transfer level: Needs assistance Equipment used: Rolling walker (2 wheeled) Transfers: Sit to/from Stand Sit to Stand: Min assist         General transfer comment: assist to rise, stabilize, control descent. multimodal cues required.   Ambulation/Gait Ambulation/Gait assistance: Min assist Ambulation Distance (Feet): 115 Feet Assistive device: Rolling walker (2 wheeled) Gait Pattern/deviations:  Step-through pattern;Decreased stride length     General Gait Details: Assist to stabilize pt and maneuver with walker. Pt is not familiar with a RW. Pt tolerated activity fairly well.   Stairs            Wheelchair Mobility    Modified Rankin (Stroke Patients Only)       Balance Overall balance assessment: Needs assistance           Standing balance-Leahy Scale: Poor                               Pertinent Vitals/Pain Pain Assessment: No/denies pain    Home Living Family/patient expects to be discharged to:: Private residence Living Arrangements: Spouse/significant other   Type of Home: Assisted living Home Access: Level entry     Home Layout: One level Home Equipment: Walker - 2 wheels;Cane - single point      Prior Function Level of Independence: Independent with assistive device(s)         Comments: per son, pt is ambulatory with a cane. No assistance was needed for ambulation or ADLs     Pizzimenti Dominance        Extremity/Trunk Assessment   Upper Extremity Assessment Upper Extremity Assessment: Generalized weakness    Lower Extremity Assessment Lower Extremity Assessment: Generalized weakness    Cervical / Trunk Assessment Cervical / Trunk Assessment: Normal  Communication   Communication: No difficulties  Cognition Arousal/Alertness: Awake/alert Behavior During Therapy: WFL for tasks assessed/performed Overall Cognitive Status: History of cognitive impairments - at  baseline                      General Comments      Exercises     Assessment/Plan    PT Assessment Patient needs continued PT services  PT Problem List Decreased strength;Decreased mobility;Decreased activity tolerance;Decreased balance;Decreased cognition;Decreased knowledge of use of DME          PT Treatment Interventions DME instruction;Therapeutic activities;Gait training;Therapeutic exercise;Patient/family education;Functional mobility  training;Balance training    PT Goals (Current goals can be found in the Care Plan section)  Acute Rehab PT Goals Patient Stated Goal: per son, return to ALF PT Goal Formulation: With family Time For Goal Achievement: 08/01/16 Potential to Achieve Goals: Good    Frequency Min 3X/week   Barriers to discharge        Co-evaluation               End of Session Equipment Utilized During Treatment: Gait belt;Oxygen Activity Tolerance: Patient tolerated treatment well Patient left: in chair;with call bell/phone within reach;with family/visitor present;with chair alarm set           Time: 1425-1452 PT Time Calculation (min) (ACUTE ONLY): 27 min   Charges:   PT Evaluation $PT Eval Low Complexity: 1 Procedure PT Treatments $Gait Training: 8-22 mins   PT G Codes:        Weston Anna, MPT Pager: [phone]

## 2016-07-18 NOTE — Progress Notes (Signed)
PROGRESS NOTE  [name] [id number] DOB: 08/24/1932 DOA: 07/17/2016 PCP: Reginia Naas, MD  Brief History:   83 y.o. male with medical history of hypertension, hyperlipidemia, coronary artery disease, dementia presented with one-day history of shortness of breath and coughing. The patient is a poor historian secondary to his history of dementia. He resides at [facility].  Apparently, the patient's wife also had recent upper respiratory type symptoms including cough, and shortness of breath. She was also in the emergency department at the same time as this patient. Apparently, per patient's family, she also has influenza. The patient denies any fevers, chills, nausea, vomiting, diarrhea, abdominal pain. He complains of shortness of breath and coughing yellow sputum.   In the emergency department, the patient was noted to be hypoxic with oxygen saturation 86-87% on room air. With 3 L nasal cannula, oxygen saturation improved to 96-97 percent. The patient was afebrile and hemodynamically stable. BMP showed a serum creatinine of 1.59. Otherwise also was unremarkable. WBC was 10.2 with platelets 124,000. Chest x-ray showed chronic interstitial changes without distinct infiltrate. Influenza A was positive.  Assessment/Plan: Acute respiratory failure with hypoxia -The patient has significant bronchospasm and wheezing -cotinue DuoNebs -start IV solumedrol 60 mg IV bid -wean oxygen for sat > 92% -presently stable on 3 L-->96% -check ambulatory pulseox, suspect he will need home oxygen -~30 pack year hx tobacco, quit in mid 1980s -repeat CXR  Influenza with pulmonary manifestations -continue oseltamivir -droplet isolation  Atypical chest pain/elevated troponin -EKG-sinus rhythm, nonspecific ST changes -elevated troponins = demand ischemia -continue Imdur -continue ASA -continue Lipitor  AKI -secondary to volume depletion -baseline creatinine 0.9 -  1.0 -presenting creatinine 1.59 -hold Maxzide -IVF x 12 hours-->improved  Dementia without behavior disturbance -cotinue Aricept and Namenda  Hypertension -continue metoprolol succinate -d/c Maxzide  Hyperglycemia -check A1C--pending  Hypokalemia -replete -mag 2.1 -am BMP    Disposition Plan:   [employer] in 1-2 days Family Communication:   No Family at bedside--Total time spent 35 minutes.  Greater than 50% spent face to face counseling and coordinating care.   Consultants:  none  Code Status:  FULL  DVT Prophylaxis: Gerty Lovenox   Procedures: As Listed in Progress Note Above  Antibiotics: None    Subjective: Patient continues to have some shortness of breath particularly with exertion. Complains of a nonproductive cough. Denies any fevers, chills, chest pain, nausea, vomiting, diarrhea, abdominal pain. No dysuria or hematuria. No rashes.  Objective: Vitals:   07/18/16 0505 07/18/16 0913 07/18/16 0953 07/18/16 0955  BP: (!) 142/78 134/78    Pulse: 66 99    Resp: 16 20    Temp: 97.5 F (36.4 C)     TempSrc: Oral     SpO2: 94% 93% 91% 91%  Weight:        Intake/Output Summary (Last 24 hours) at 07/18/16 1026 Last data filed at 07/18/16 0630  Gross per 24 hour  Intake              950 ml  Output                0 ml  Net              950 ml   Weight change:  Exam:   General:  Pt is alert, follows commands appropriately, not in acute distress  HEENT: No icterus, No thrush, No neck mass, [place]/AT  Cardiovascular: RRR, S1/S2, no rubs,  no gallops  Respiratory: Bibasilar rales. Bilateral expiratory wheeze. Good air movement.  Abdomen: Soft/+BS, non tender, non distended, no guarding  Extremities: trace LE edema, No lymphangitis, No petechiae, No rashes, no synovitis   Data Reviewed: I have personally reviewed following labs and imaging studies Basic Metabolic Panel:  Recent Labs Lab 07/17/16 1423 07/18/16 0313  NA 140 141  K 3.6  3.1*  CL 100* 105  CO2 28 26  GLUCOSE 132* 100*  BUN 41* 32*  CREATININE 1.59* 1.20  CALCIUM 9.5 8.6*  MG  --  2.1   Liver Function Tests: No results for input(s): AST, ALT, ALKPHOS, BILITOT, PROT, ALBUMIN in the last 168 hours. No results for input(s): LIPASE, AMYLASE in the last 168 hours. No results for input(s): AMMONIA in the last 168 hours. Coagulation Profile: No results for input(s): INR, PROTIME in the last 168 hours. CBC:  Recent Labs Lab 07/17/16 1423  WBC 10.2  HGB 14.7  HCT 42.3  MCV 91.6  PLT 124*   Cardiac Enzymes:  Recent Labs Lab 07/17/16 2157 07/18/16 0313 07/18/16 0826  TROPONINI 0.09* 0.09* 0.08*   BNP: Invalid input(s): POCBNP CBG:  Recent Labs Lab 07/17/16 2126  GLUCAP 110*   HbA1C: No results for input(s): HGBA1C in the last 72 hours. Urine analysis:    Component Value Date/Time   COLORURINE YELLOW 03/16/2008 1410   APPEARANCEUR CLEAR 03/16/2008 1410   LABSPEC 1.011 03/16/2008 1410   PHURINE 6.5 03/16/2008 1410   GLUCOSEU NEGATIVE 03/16/2008 1410   HGBUR NEGATIVE 03/16/2008 1410   BILIRUBINUR NEGATIVE 03/16/2008 1410   KETONESUR NEGATIVE 03/16/2008 1410   PROTEINUR NEGATIVE 03/16/2008 1410   UROBILINOGEN 0.2 03/16/2008 1410   NITRITE NEGATIVE 03/16/2008 1410   LEUKOCYTESUR  03/16/2008 1410    NEGATIVE MICROSCOPIC NOT DONE ON URINES WITH NEGATIVE PROTEIN, BLOOD, LEUKOCYTES, NITRITE, OR GLUCOSE <1000 mg/dL.   Sepsis Labs: @LABRCNTIP (procalcitonin:4,lacticidven:4) )No results found for this or any previous visit (from the past 240 hour(s)).   Scheduled Meds: . aspirin  81 mg Oral Daily  . atorvastatin  40 mg Oral Daily  . donepezil  10 mg Oral QHS  . enoxaparin (LOVENOX) injection  40 mg Subcutaneous Q24H  . ipratropium-albuterol  3 mL Nebulization QID  . isosorbide mononitrate  30 mg Oral Daily  . memantine  28 mg Oral Daily  . methylPREDNISolone (SOLU-MEDROL) injection  60 mg Intravenous Q12H  . metoprolol succinate  50  mg Oral Daily  . multivitamin with minerals  1 tablet Oral Daily  . omega-3 acid ethyl esters  1 g Oral BID  . oseltamivir  30 mg Oral BID  . potassium chloride  40 mEq Oral Once   Continuous Infusions: . sodium chloride 125 mL/hr at 07/17/16 1524    Procedures/Studies: Dg Chest 2 View  Result Date: 07/17/2016 CLINICAL DATA:  Family member hospitalized with flu. Was told to be evaluated for flu. Low oxygenation at 88%. "To for EXAM: CHEST  2 VIEW COMPARISON:  CXR 09/07/2015 FINDINGS: Heart is normal in size. There is aortic atherosclerosis without aneurysm. Mild interstitial fibrosis noted at the lung bases with minimal atelectasis also likely present. No pneumonic consolidation is noted. There is no pulmonary edema. No suspicious osseous abnormalities chronic. Lower thoracic compression deformity is again seen. IMPRESSION: Mild interstitial fibrosis is suspected at the lung bases characterized by fine interstitial lung markings. No acute pulmonary disease. Aortic atherosclerosis. Electronically Signed   By: Ashley Royalty M.D.   On: 07/17/2016 15:06  Pedrohenrique Mcconville, DO  [facility] Pager [phone]  If 7PM-7AM, please contact night-coverage [email or web address] Password [email or web address] 07/18/2016, 10:26 AM   LOS: 1 day

## 2016-07-18 NOTE — Discharge Summary (Signed)
Physician Discharge Summary  [name] [id number] DOB: Sep 08, 1932 DOA: 07/17/2016  PCP: Reginia Naas, MD  Admit date: 07/17/2016 Discharge date: 07/19/16  Admitted From: [name] Disposition:  [name]  Recommendations for Outpatient Follow-up:  1. Follow up with PCP in 1-2 weeks 2. Please obtain BMP/CBC in one week  Home Health:YES Equipment/Devices:HHPT, 2 L oxygen  Discharge Condition: Stable CODE STATUS:FULL Diet recommendation: Heart Healthy   Brief/Interim Summary: 83 y.o.malewith medical history of hypertension, hyperlipidemia, coronary artery disease, dementia presented with one-day history of shortness of breath and coughing. The patient is a poor historian secondary to his history of dementia. He resides at [facility]. Apparently, the patient's wife also had recent upper respiratory type symptoms including cough, and shortness of breath. She was also in the emergency department at the same time as this patient. Apparently, per patient's family, she also has influenza.The patient denies any fevers, chills, nausea, vomiting, diarrhea, abdominal pain. He complains of shortness of breath and coughing yellow sputum.   In the emergency department, the patient was noted to be hypoxic with oxygen saturation 86-87% on room air. With 3 L nasal cannula, oxygen saturation improved to 96-97 percent. The patient was afebrile and hemodynamically stable. BMP showed a serum creatinine of 1.59. Otherwise also was unremarkable. WBC was 10.2 with platelets 124,000. Chest x-ray showed chronic interstitial changes without distinct infiltrate. Influenza A was positive.The patient was started on oseltamivir. Unfortunately, the patient continued to have significant bronchospasm. He was started on steroids with improvement.Although the patient continued to have some audible wheezing, much of it was noted to be upper airway. There is a question whether the patient  may have a degree of vocal cord dysfunction resulting in his wheezing. He'll be discharged with prednisone taper.  Discharge Diagnoses:  Acute respiratory failure with hypoxia -The patient has significant bronchospasm and wheezing -cotinue DuoNebs -start IV solumedrol 60 mg IV bid--home with prednisone taper -wean oxygen for sat > 92% -presently stable on 2 L-->96% -check ambulatory pulseox--showed desaturation-->pt will go home with 2 L [place] -~30 pack year hx tobacco, quit in mid 1980s -07/18/16-repeat CXR--Peripheral basilar predominant reticular interstitial opacities suggesting interstitial lung disease -home with prednisone taper over 6 days  Influenza with pulmonary manifestations -continue oseltamivir--plan 3 more days after discharge -droplet isolation  Atypical chest pain/elevated troponin -EKG-sinus rhythm, nonspecific ST changes -elevated troponins = demand ischemia -continue Imdur -continue ASA -continue Lipitor  AKI -secondary to volume depletion -baseline creatinine 0.9 - 1.0 -presenting creatinine 1.59 -hold Maxzide--will not restart after discharge -IVF x 12 hours-->improved -Serum creatinine 0.94 on the day of discharge  Dementia without behavior disturbance -cotinue Aricept and Namenda  Hypertension -continue metoprolol succinate -d/c Maxzide--will not restart after d/c -BP stable/controlled off of Maxzide  Hyperglycemia -check A1C--5.6 -Steroid-induced  Hypokalemia -repleted -mag 2.1    Discharge Instructions  Discharge Instructions    Diet - low sodium heart healthy    Complete by:  As directed    Increase activity slowly    Complete by:  As directed      Allergies as of 07/19/2016      Reactions   Codeine Other (See Comments)   Anxiety and nervous feelings   Crestor [rosuvastatin Calcium] Other (See Comments)   Muscle pains in knees      Medication List    STOP taking these medications   nitroGLYCERIN 0.4 MG SL  tablet Commonly known as:  NITROSTAT   triamterene-hydrochlorothiazide 37.5-25 MG tablet Commonly known as:  [employer]  TAKE these medications   acetaminophen 325 MG tablet Commonly known as:  TYLENOL Take 325 mg by mouth every 6 (six) hours as needed for moderate pain, fever or headache.   aspirin 81 MG chewable tablet Chew 81 mg by mouth daily.   atorvastatin 40 MG tablet Commonly known as:  LIPITOR Take 40 mg by mouth daily.   donepezil 10 MG tablet Commonly known as:  ARICEPT Take 10 mg by mouth at bedtime.   isosorbide mononitrate 30 MG 24 hr tablet Commonly known as:  IMDUR Take 30 mg by mouth daily.   meclizine 25 MG tablet Commonly known as:  ANTIVERT Take 1 tablet (25 mg total) by mouth 3 (three) times daily as needed for dizziness.   metoprolol succinate 50 MG 24 hr tablet Commonly known as:  TOPROL-XL Take 50 mg by mouth daily.   multivitamin with minerals Tabs tablet Take 1 tablet by mouth daily.   NAMENDA XR 28 MG Cp24 24 hr capsule Generic drug:  memantine Take 28 mg by mouth daily.   oseltamivir 30 MG capsule Commonly known as:  TAMIFLU Take 1 capsule (30 mg total) by mouth 2 (two) times daily.   predniSONE 10 MG tablet Commonly known as:  DELTASONE Take 6 tablets (60 mg total) by mouth daily with breakfast. And decrease by one tablet daily Start taking on:  07/20/2016   VASCEPA 1 g Caps Generic drug:  Icosapent Ethyl Take 2 capsules by mouth 2 (two) times daily.            Durable Medical Equipment        Start     Ordered   07/19/16 1132  For home use only DME oxygen  Once    Question Answer Comment  Mode or (Route) Nasal cannula   Liters per Minute 2   Frequency Continuous (stationary and portable oxygen unit needed)   Oxygen conserving device Yes   Oxygen delivery system Gas      07/19/16 1131      Allergies  Allergen Reactions  . Codeine Other (See Comments)    Anxiety and nervous feelings   . Crestor  [Rosuvastatin Calcium] Other (See Comments)    Muscle pains in knees    Consultations:  none   Procedures/Studies: Dg Chest 2 View  Result Date: 07/18/2016 CLINICAL DATA:  Acute respiratory failure with hypoxia EXAM: CHEST  2 VIEW COMPARISON:  Chest radiograph from one day prior. FINDINGS: Stable cardiomediastinal silhouette with top-normal heart size and minimally tortuous atherosclerotic thoracic aorta. No pneumothorax. No pleural effusion. No pulmonary edema. There are peripheral basilar predominant reticular interstitial opacities in both lungs, which may have worsened compared to 04/14/2013 chest radiograph. No acute consolidative airspace disease. IMPRESSION: 1. No acute cardiopulmonary disease. 2. Peripheral basilar predominant reticular interstitial opacities suggesting interstitial lung disease, possibly progressed since 04/14/2013 chest radiograph. Correlation with high-resolution chest CT could be obtained as clinically warranted. 3. Aortic atherosclerosis. Electronically Signed   By: Ilona Sorrel M.D.   On: 07/18/2016 11:53   Dg Chest 2 View  Result Date: 07/17/2016 CLINICAL DATA:  Family member hospitalized with flu. Was told to be evaluated for flu. Low oxygenation at 88%. "To for EXAM: CHEST  2 VIEW COMPARISON:  CXR 09/07/2015 FINDINGS: Heart is normal in size. There is aortic atherosclerosis without aneurysm. Mild interstitial fibrosis noted at the lung bases with minimal atelectasis also likely present. No pneumonic consolidation is noted. There is no pulmonary edema. No suspicious osseous abnormalities chronic. Lower thoracic  compression deformity is again seen. IMPRESSION: Mild interstitial fibrosis is suspected at the lung bases characterized by fine interstitial lung markings. No acute pulmonary disease. Aortic atherosclerosis. Electronically Signed   By: Ashley Royalty M.D.   On: 07/17/2016 15:06        Discharge Exam: Vitals:   07/18/16 2143 07/19/16 0519  BP: 132/72  131/73  Pulse: 95 86  Resp: 16 14  Temp: 97.6 F (36.4 C) 98.8 F (37.1 C)   Vitals:   07/18/16 2052 07/18/16 2143 07/19/16 0519 07/19/16 0815  BP:  132/72 131/73   Pulse:  95 86   Resp:  16 14   Temp:  97.6 F (36.4 C) 98.8 F (37.1 C)   TempSrc:  Oral Oral   SpO2: 94% 97% 95% 95%  Weight:        General: Pt is alert, awake, not in acute distress Cardiovascular: RRR, S1/S2 +, no rubs, no gallops Respiratory: upper airway wheeze.  Bibasilar rales, good air movement Abdominal: Soft, NT, ND, bowel sounds + Extremities: no edema, no cyanosis   The results of significant diagnostics from this hospitalization (including imaging, microbiology, ancillary and laboratory) are listed below for reference.    Significant Diagnostic Studies: Dg Chest 2 View  Result Date: 07/18/2016 CLINICAL DATA:  Acute respiratory failure with hypoxia EXAM: CHEST  2 VIEW COMPARISON:  Chest radiograph from one day prior. FINDINGS: Stable cardiomediastinal silhouette with top-normal heart size and minimally tortuous atherosclerotic thoracic aorta. No pneumothorax. No pleural effusion. No pulmonary edema. There are peripheral basilar predominant reticular interstitial opacities in both lungs, which may have worsened compared to 04/14/2013 chest radiograph. No acute consolidative airspace disease. IMPRESSION: 1. No acute cardiopulmonary disease. 2. Peripheral basilar predominant reticular interstitial opacities suggesting interstitial lung disease, possibly progressed since 04/14/2013 chest radiograph. Correlation with high-resolution chest CT could be obtained as clinically warranted. 3. Aortic atherosclerosis. Electronically Signed   By: Ilona Sorrel M.D.   On: 07/18/2016 11:53   Dg Chest 2 View  Result Date: 07/17/2016 CLINICAL DATA:  Family member hospitalized with flu. Was told to be evaluated for flu. Low oxygenation at 88%. "To for EXAM: CHEST  2 VIEW COMPARISON:  CXR 09/07/2015 FINDINGS: Heart is normal  in size. There is aortic atherosclerosis without aneurysm. Mild interstitial fibrosis noted at the lung bases with minimal atelectasis also likely present. No pneumonic consolidation is noted. There is no pulmonary edema. No suspicious osseous abnormalities chronic. Lower thoracic compression deformity is again seen. IMPRESSION: Mild interstitial fibrosis is suspected at the lung bases characterized by fine interstitial lung markings. No acute pulmonary disease. Aortic atherosclerosis. Electronically Signed   By: Ashley Royalty M.D.   On: 07/17/2016 15:06     Microbiology: No results found for this or any previous visit (from the past 240 hour(s)).   Labs: Basic Metabolic Panel:  Recent Labs Lab 07/17/16 1423 07/18/16 0313 07/19/16 0432  NA 140 141 139  K 3.6 3.1* 3.7  CL 100* 105 106  CO2 28 26 26   GLUCOSE 132* 100* 170*  BUN 41* 32* 23*  CREATININE 1.59* 1.20 0.94  CALCIUM 9.5 8.6* 8.3*  MG  --  2.1  --    Liver Function Tests: No results for input(s): AST, ALT, ALKPHOS, BILITOT, PROT, ALBUMIN in the last 168 hours. No results for input(s): LIPASE, AMYLASE in the last 168 hours. No results for input(s): AMMONIA in the last 168 hours. CBC:  Recent Labs Lab 07/17/16 1423  WBC 10.2  HGB 14.7  HCT 42.3  MCV 91.6  PLT 124*   Cardiac Enzymes:  Recent Labs Lab 07/17/16 2157 07/18/16 0313 07/18/16 0826  TROPONINI 0.09* 0.09* 0.08*   BNP: Invalid input(s): POCBNP CBG:  Recent Labs Lab 07/17/16 2126  GLUCAP 110*    Time coordinating discharge:  Greater than 30 minutes  Signed:  Larkin Alfred, DO [facility] Pager: [phone] 07/19/2016, 11:35 AM

## 2016-07-19 DIAGNOSIS — J111 Influenza due to unidentified influenza virus with other respiratory manifestations: Secondary | ICD-10-CM

## 2016-07-19 DIAGNOSIS — N179 Acute kidney failure, unspecified: Secondary | ICD-10-CM

## 2016-07-19 DIAGNOSIS — J9600 Acute respiratory failure, unspecified whether with hypoxia or hypercapnia: Secondary | ICD-10-CM

## 2016-07-19 LAB — BASIC METABOLIC PANEL
Anion gap: 7 (ref 5–15)
BUN: 23 mg/dL — ABNORMAL HIGH (ref 6–20)
CO2: 26 mmol/L (ref 22–32)
Calcium: 8.3 mg/dL — ABNORMAL LOW (ref 8.9–10.3)
Chloride: 106 mmol/L (ref 101–111)
Creatinine, Ser: 0.94 mg/dL (ref 0.61–1.24)
GFR calc Af Amer: >60 mL/min (ref >60)
GFR calc non Af Amer: >60 mL/min (ref >60)
Glucose, Bld: 170 mg/dL — ABNORMAL HIGH (ref 65–99)
Potassium: 3.7 mmol/L (ref 3.5–5.1)
Sodium: 139 mmol/L (ref 135–145)

## 2016-07-19 LAB — HEMOGLOBIN A1C
HEMOGLOBIN A1C: 5.6 % (ref 4.8–5.6)
Mean Plasma Glucose: 114 mg/dL

## 2016-07-19 MED ORDER — OSELTAMIVIR PHOSPHATE 30 MG PO CAPS: 30.0000 mg | ORAL_CAPSULE | Freq: Two times a day (BID) | ORAL | 0 refills | Status: AC

## 2016-07-19 MED ORDER — PREDNISONE 10 MG PO TABS: 60.0000 mg | ORAL_TABLET | Freq: Every day | ORAL | 0 refills | Status: AC

## 2016-07-19 NOTE — Progress Notes (Signed)
Report called to Lattie Haw, Therapist, sports at [facility].

## 2016-07-19 NOTE — Progress Notes (Signed)
CSW received call back from [place] at [facility] that oxygen has been deliviered. RN, Kim & daughter-in-law, Lynelle Smoke [phone]) made aware. [facility] called for transport.    Raynaldo Opitz, [facility] Clinical Social Worker cell #: [phone]

## 2016-07-19 NOTE — NC FL2 (Signed)
[place] LEVEL OF CARE SCREENING TOOL     IDENTIFICATION  Patient Name: [name] Birthdate: 1932/07/15 Sex: male Admission Date (Current Location): 07/17/2016  [facility] and [place] Number:  Herbalist and Address:  [facility],  [place] [address], [place]      Provider Number: [id number]  Attending Physician Name and Address:  Orson Eva, MD  Relative Name and Phone Number:       Current Level of Care: Hospital Recommended Level of Care: [place] Prior Approval Number:    Date Approved/Denied:   PASRR Number:    Discharge Plan: Other (Comment) (ALF)    Current Diagnoses: Patient Active Problem List   Diagnosis Date Noted  . AKI (acute kidney injury) ([place]) 07/19/2016  . Acute respiratory failure with hypoxia ([place]) 07/17/2016  . Influenza A with respiratory manifestations 07/17/2016  . NSVT (nonsustained ventricular tachycardia) ([place]) 06/05/2014  . CAD (coronary artery disease)- mild 2003 06/04/2014  . Chest pain 06/03/2014  . Atherosclerosis of aorta ([place]) 05/03/2013  . PVC (premature ventricular contraction) 05/03/2013  . Chest pain with moderate risk of acute coronary syndrome 08/30/2011  . Hypercholesterolemia with hypertriglyceridemia 01/09/2011  . Chronic ischemic heart disease 01/09/2011  . Benign hypertensive heart disease without heart failure 01/09/2011  . Dementia 01/09/2011    Orientation RESPIRATION BLADDER Height & Weight     Self  O2 Incontinent Weight: 180 lb 5.4 oz (81.8 kg) Height:     BEHAVIORAL SYMPTOMS/MOOD NEUROLOGICAL BOWEL NUTRITION STATUS  Other (Comment) (no behaviors)   Continent Diet (regular)  AMBULATORY STATUS COMMUNICATION OF NEEDS Skin   Limited Assist Verbally Normal                       Personal Care Assistance Level of Assistance  Bathing, Feeding, Dressing Bathing Assistance: Limited assistance Feeding assistance: Independent Dressing Assistance:  Limited assistance     Functional Limitations Info  Sight, Hearing, Speech Sight Info: Adequate Hearing Info: Adequate Speech Info: Adequate    SPECIAL CARE FACTORS FREQUENCY  PT (By licensed PT)     PT Frequency: 3x wk              Contractures Contractures Info: Not present    Additional Factors Info  Code Status, Allergies Code Status Info: Full Code Allergies Info: Codeine, CRESTOR           Current Medications (07/19/2016):  This is the current hospital active medication list Current Facility-Administered Medications  Medication Dose Route Frequency Provider Last Rate Last Dose  . 0.9 %  sodium chloride infusion   Intravenous Continuous Dorie Rank, MD 125 mL/hr at 07/18/16 1154    . acetaminophen (TYLENOL) tablet 650 mg  650 mg Oral Q6H PRN Orson Eva, MD       Or  . acetaminophen (TYLENOL) suppository 650 mg  650 mg Rectal Q6H PRN Orson Eva, MD      . ALPRAZolam Duanne Moron) tablet 0.5 mg  0.5 mg Oral Q12H PRN Orson Eva, MD   0.5 mg at 07/18/16 2150  . aspirin chewable tablet 81 mg  81 mg Oral Daily Orson Eva, MD   81 mg at 07/19/16 0954  . atorvastatin (LIPITOR) tablet 40 mg  40 mg Oral Daily Orson Eva, MD   40 mg at 07/19/16 0955  . donepezil (ARICEPT) tablet 10 mg  10 mg Oral QHS Orson Eva, MD   10 mg at 07/18/16 2149  . enoxaparin (LOVENOX)  injection 40 mg  40 mg Subcutaneous Q24H Orson Eva, MD   40 mg at 07/18/16 2149  . ipratropium-albuterol (DUONEB) 0.5-2.5 (3) MG/3ML nebulizer solution 3 mL  3 mL Nebulization QID Orson Eva, MD   3 mL at 07/19/16 1246  . isosorbide mononitrate (IMDUR) 24 hr tablet 30 mg  30 mg Oral Daily Orson Eva, MD   30 mg at 07/19/16 0955  . meclizine (ANTIVERT) tablet 25 mg  25 mg Oral TID PRN Orson Eva, MD      . memantine (NAMENDA XR) 24 hr capsule 28 mg  28 mg Oral Daily Orson Eva, MD   28 mg at 07/19/16 0954  . metoprolol succinate (TOPROL-XL) 24 hr tablet 50 mg  50 mg Oral Daily Orson Eva, MD   50 mg at 07/19/16 0954  . multivitamin with  minerals tablet 1 tablet  1 tablet Oral Daily Orson Eva, MD   1 tablet at 07/19/16 0955  . omega-3 acid ethyl esters (LOVAZA) capsule 1 g  1 g Oral BID Orson Eva, MD   1 g at 07/19/16 0955  . ondansetron (ZOFRAN) tablet 4 mg  4 mg Oral Q6H PRN Orson Eva, MD       Or  . ondansetron [facility]) injection 4 mg  4 mg Intravenous Q6H PRN Orson Eva, MD      . oseltamivir (TAMIFLU) capsule 30 mg  30 mg Oral BID Dorie Rank, MD   30 mg at 07/19/16 0955  . predniSONE (DELTASONE) tablet 60 mg  60 mg Oral Q breakfast Orson Eva, MD   60 mg at 07/19/16 [id number]     Discharge Medications: Please see discharge summary for a list of discharge medications.  Relevant Imaging Results:  Relevant Lab Results:   Additional Information SS # [id number]. Pt requires oxygen at faciilty. PT is also required at ALF.  Jaquay Morneault, Randall An, LCSW

## 2016-07-19 NOTE — Progress Notes (Signed)
This CM alerted of orders for home 02 and HHPT. Pt to DC to [facility] ALF. Per CSW, [facility] uses [facility] for both home health services and DME. [facility] alerted of need for HHPT and home 02. MD orders received. Transport being arranged by CSW. No other CM needs communicated. Marney Doctor RN,BSN,NCM [phone]

## 2016-07-19 NOTE — Progress Notes (Signed)
Pt / son are in agreement with d/c back to [facility] today. [facility] transport is needed. Meical necessity form completed. Son is aware out of pocket costs may be associated with [facility] transport. D/C Summary / FL2 sent to ALF for review. Scripts included in d/c packet and faxed to ALF. CSW will arrange [facility] transport once oxygen has been delivered to ALF.  Werner Lean LCSW [phone]

## 2016-07-19 NOTE — Progress Notes (Signed)
Physical Therapy Treatment Patient Details Name: [name] MRN: [id number] DOB: 11/14/1932 Today's Date: 07/19/2016    History of Present Illness 83 yo male admitted with acute respiratory failure, + flu. Hx of dementia HTN, CAD    PT Comments    Assisted OOB to amb however distance limited due to loose uncontrolled stools.  Assisted to bathroom.  Assisted with hygiene then back to bed to rest.  Pt weezy and coughing a lot with activity.  RA avg 88%  Follow Up Recommendations  Home health PT;Supervision/Assistance - 24 hour (at ALF)     Equipment Recommendations       Recommendations for Other Services       Precautions / Restrictions Precautions Precautions: Fall Precaution Comments: monitor O2 sats Restrictions Weight Bearing Restrictions: No    Mobility  Bed Mobility Overal bed mobility: Needs Assistance Bed Mobility: Supine to Sit;Sit to Supine     Supine to sit: Supervision Sit to supine: Supervision   General bed mobility comments: for safety. cues required     assisted OOB and back to bed   Transfers Overall transfer level: Needs assistance Equipment used: Rolling walker (2 wheeled) Transfers: Sit to/from Stand Sit to Stand: Min assist;Min guard         General transfer comment: assist to rise, stabilize, control descent. multimodal cues required for direction  Ambulation/Gait Ambulation/Gait assistance: Min assist Ambulation Distance (Feet): 45 Feet Assistive device: Rolling walker (2 wheeled) Gait Pattern/deviations: Step-through pattern;Decreased stride length Gait velocity: decreased   General Gait Details: Assist to stabilize pt and maneuver with walker. Pt is not familiar with a RW. Pt tolerated activity fairly well.    Stairs            Wheelchair Mobility    Modified Rankin (Stroke Patients Only)       Balance                                    Cognition Arousal/Alertness: Awake/alert Behavior During  Therapy: WFL for tasks assessed/performed Overall Cognitive Status: History of cognitive impairments - at baseline                 General Comments: pleasant and following commands.  Repeated same questions ex "are you married".  cute    Exercises      General Comments        Pertinent Vitals/Pain Pain Assessment: No/denies pain    Home Living                      Prior Function            PT Goals (current goals can now be found in the care plan section) Progress towards PT goals: Progressing toward goals    Frequency    Min 3X/week      PT Plan Current plan remains appropriate    Co-evaluation             End of Session Equipment Utilized During Treatment: Gait belt Activity Tolerance: Patient tolerated treatment well Patient left: in bed;with call bell/phone within reach;with bed alarm set     Time: [id number] PT Time Calculation (min) (ACUTE ONLY): 24 min  Charges:  $Gait Training: 8-22 mins $Therapeutic Exercise: 8-22 mins                    G Codes:  Rica Koyanagi  PTA [facility]  Acute  Rehab Pager      [phone]

## 2016-07-19 NOTE — NC FL2 (Signed)
[place] LEVEL OF CARE SCREENING TOOL     IDENTIFICATION  Patient Name: [name] Birthdate: [birth year] Sex: male Admission Date (Current Location): 07/17/2016  [facility] and [place] Number:  Herbalist and Address:  [facility],  [place] [address] [place]      Provider Number: [id number]  Attending Physician Name and Address:  Orson Eva, MD  Relative Name and Phone Number:       Current Level of Care: Hospital Recommended Level of Care: [place] Prior Approval Number:    Date Approved/Denied:   PASRR Number:    Discharge Plan: Other (Comment) (ALF)    Current Diagnoses: Patient Active Problem List   Diagnosis Date Noted  . AKI (acute kidney injury) ([place]) 07/19/2016  . Acute respiratory failure with hypoxia ([place]) 07/17/2016  . Influenza A with respiratory manifestations 07/17/2016  . NSVT (nonsustained ventricular tachycardia) ([place]) 06/05/2014  . CAD (coronary artery disease)- mild 2003 06/04/2014  . Chest pain 06/03/2014  . Atherosclerosis of aorta ([place]) 05/03/2013  . PVC (premature ventricular contraction) 05/03/2013  . Chest pain with moderate risk of acute coronary syndrome 08/30/2011  . Hypercholesterolemia with hypertriglyceridemia 01/09/2011  . Chronic ischemic heart disease 01/09/2011  . Benign hypertensive heart disease without heart failure 01/09/2011  . Dementia 01/09/2011    Orientation RESPIRATION BLADDER Height & Weight     Self  O2 Incontinent Weight: 180 lb 5.4 oz (81.8 kg) Height:     BEHAVIORAL SYMPTOMS/MOOD NEUROLOGICAL BOWEL NUTRITION STATUS  Other (Comment) (no behaviors)   Continent Diet (regular)  AMBULATORY STATUS COMMUNICATION OF NEEDS Skin   Limited Assist Verbally Normal                       Personal Care Assistance Level of Assistance  Bathing, Feeding, Dressing Bathing Assistance: Limited assistance Feeding assistance: Independent Dressing Assistance:  Limited assistance     Functional Limitations Info  Sight, Hearing, Speech Sight Info: Adequate Hearing Info: Adequate Speech Info: Adequate    SPECIAL CARE FACTORS FREQUENCY  PT (By licensed PT)     PT Frequency: 3x wk              Contractures Contractures Info: Not present    Additional Factors Info  Code Status, Allergies Code Status Info: Full Code Allergies Info: Codeine, CRESTOR           Current Medications (07/19/2016):  This is the current hospital active medication list Current Facility-Administered Medications  Medication Dose Route Frequency Provider Last Rate Last Dose  . 0.9 %  sodium chloride infusion   Intravenous Continuous Dorie Rank, MD 125 mL/hr at 07/18/16 1154    . acetaminophen (TYLENOL) tablet 650 mg  650 mg Oral Q6H PRN Orson Eva, MD       Or  . acetaminophen (TYLENOL) suppository 650 mg  650 mg Rectal Q6H PRN Orson Eva, MD      . ALPRAZolam Duanne Moron) tablet 0.5 mg  0.5 mg Oral Q12H PRN Orson Eva, MD   0.5 mg at 07/18/16 2150  . aspirin chewable tablet 81 mg  81 mg Oral Daily Orson Eva, MD   81 mg at 07/19/16 0954  . atorvastatin (LIPITOR) tablet 40 mg  40 mg Oral Daily Orson Eva, MD   40 mg at 07/19/16 0955  . donepezil (ARICEPT) tablet 10 mg  10 mg Oral QHS Orson Eva, MD   10 mg at 07/18/16 2149  . enoxaparin (LOVENOX)  injection 40 mg  40 mg Subcutaneous Q24H Orson Eva, MD   40 mg at 07/18/16 2149  . ipratropium-albuterol (DUONEB) 0.5-2.5 (3) MG/3ML nebulizer solution 3 mL  3 mL Nebulization QID Orson Eva, MD   3 mL at 07/19/16 0815  . isosorbide mononitrate (IMDUR) 24 hr tablet 30 mg  30 mg Oral Daily Orson Eva, MD   30 mg at 07/19/16 0955  . meclizine (ANTIVERT) tablet 25 mg  25 mg Oral TID PRN Orson Eva, MD      . memantine (NAMENDA XR) 24 hr capsule 28 mg  28 mg Oral Daily Orson Eva, MD   28 mg at 07/19/16 0954  . metoprolol succinate (TOPROL-XL) 24 hr tablet 50 mg  50 mg Oral Daily Orson Eva, MD   50 mg at 07/19/16 0954  . multivitamin with  minerals tablet 1 tablet  1 tablet Oral Daily Orson Eva, MD   1 tablet at 07/19/16 0955  . omega-3 acid ethyl esters (LOVAZA) capsule 1 g  1 g Oral BID Orson Eva, MD   1 g at 07/19/16 0955  . ondansetron (ZOFRAN) tablet 4 mg  4 mg Oral Q6H PRN Orson Eva, MD       Or  . ondansetron [facility]) injection 4 mg  4 mg Intravenous Q6H PRN Orson Eva, MD      . oseltamivir (TAMIFLU) capsule 30 mg  30 mg Oral BID Dorie Rank, MD   30 mg at 07/19/16 0955  . predniSONE (DELTASONE) tablet 60 mg  60 mg Oral Q breakfast Orson Eva, MD   60 mg at 07/19/16 [id number]     Discharge Medications: Please see discharge summary for a list of discharge medications. Medication List           STOP taking these medications          nitroGLYCERIN 0.4 MG SL tablet Commonly known as:  NITROSTAT   triamterene-hydrochlorothiazide 37.5-25 MG tablet Commonly known as:  MAXZIDE-25                   TAKE these medications          acetaminophen 325 MG tablet Commonly known as:  TYLENOL Take 325 mg by mouth every 6 (six) hours as needed for moderate pain, fever or headache.   aspirin 81 MG chewable tablet Chew 81 mg by mouth daily.   atorvastatin 40 MG tablet Commonly known as:  LIPITOR Take 40 mg by mouth daily.   donepezil 10 MG tablet Commonly known as:  ARICEPT Take 10 mg by mouth at bedtime.   isosorbide mononitrate 30 MG 24 hr tablet Commonly known as:  IMDUR Take 30 mg by mouth daily.   meclizine 25 MG tablet Commonly known as:  ANTIVERT Take 1 tablet (25 mg total) by mouth 3 (three) times daily as needed for dizziness.   metoprolol succinate 50 MG 24 hr tablet Commonly known as:  TOPROL-XL Take 50 mg by mouth daily.   multivitamin with minerals Tabs tablet Take 1 tablet by mouth daily.   NAMENDA XR 28 MG Cp24 24 hr capsule Generic drug:  memantine Take 28 mg by mouth daily.   oseltamivir 30 MG capsule Commonly known as:  TAMIFLU Take 1 capsule (30 mg total) by mouth 2 (two) times  daily.   predniSONE 10 MG tablet Commonly known as:  DELTASONE Take 6 tablets (60 mg total) by mouth daily with breakfast. And decrease by one tablet daily Start taking on:  07/20/2016  VASCEPA 1 g Caps Generic drug:  Icosapent Ethyl Take 2 capsules by mouth 2 (two) times daily.      Relevant Imaging Results:  Relevant Lab Results:   Additional Information SS [id number]. Pt requires 2 liters of oxygen at facility. HHPT is also required at ALF.  Rekita Miotke, Randall An, LCSW

## 2016-07-20 DIAGNOSIS — M6281 Muscle weakness (generalized): Secondary | ICD-10-CM | POA: Diagnosis not present

## 2016-07-20 DIAGNOSIS — R296 Repeated falls: Secondary | ICD-10-CM | POA: Diagnosis not present

## 2016-07-21 DIAGNOSIS — R296 Repeated falls: Secondary | ICD-10-CM | POA: Diagnosis not present

## 2016-07-21 DIAGNOSIS — M6281 Muscle weakness (generalized): Secondary | ICD-10-CM | POA: Diagnosis not present

## 2016-07-22 DIAGNOSIS — R296 Repeated falls: Secondary | ICD-10-CM | POA: Diagnosis not present

## 2016-07-22 DIAGNOSIS — M6281 Muscle weakness (generalized): Secondary | ICD-10-CM | POA: Diagnosis not present

## 2016-07-24 DIAGNOSIS — M6281 Muscle weakness (generalized): Secondary | ICD-10-CM | POA: Diagnosis not present

## 2016-07-24 DIAGNOSIS — R296 Repeated falls: Secondary | ICD-10-CM | POA: Diagnosis not present

## 2016-07-25 DIAGNOSIS — R296 Repeated falls: Secondary | ICD-10-CM | POA: Diagnosis not present

## 2016-07-25 DIAGNOSIS — M6281 Muscle weakness (generalized): Secondary | ICD-10-CM | POA: Diagnosis not present

## 2016-07-29 DIAGNOSIS — R296 Repeated falls: Secondary | ICD-10-CM | POA: Diagnosis not present

## 2016-07-29 DIAGNOSIS — M6281 Muscle weakness (generalized): Secondary | ICD-10-CM | POA: Diagnosis not present

## 2016-07-30 DIAGNOSIS — F0150 Vascular dementia without behavioral disturbance: Secondary | ICD-10-CM | POA: Diagnosis not present

## 2016-07-30 DIAGNOSIS — M6281 Muscle weakness (generalized): Secondary | ICD-10-CM | POA: Diagnosis not present

## 2016-07-30 DIAGNOSIS — R296 Repeated falls: Secondary | ICD-10-CM | POA: Diagnosis not present

## 2016-07-30 DIAGNOSIS — J101 Influenza due to other identified influenza virus with other respiratory manifestations: Secondary | ICD-10-CM | POA: Diagnosis not present

## 2016-07-30 DIAGNOSIS — R0902 Hypoxemia: Secondary | ICD-10-CM | POA: Diagnosis not present

## 2016-07-30 DIAGNOSIS — R7989 Other specified abnormal findings of blood chemistry: Secondary | ICD-10-CM | POA: Diagnosis not present

## 2016-07-30 DIAGNOSIS — J849 Interstitial pulmonary disease, unspecified: Secondary | ICD-10-CM | POA: Diagnosis not present

## 2016-07-31 DIAGNOSIS — M6281 Muscle weakness (generalized): Secondary | ICD-10-CM | POA: Diagnosis not present

## 2016-07-31 DIAGNOSIS — R296 Repeated falls: Secondary | ICD-10-CM | POA: Diagnosis not present

## 2016-08-01 DIAGNOSIS — R296 Repeated falls: Secondary | ICD-10-CM | POA: Diagnosis not present

## 2016-08-01 DIAGNOSIS — M6281 Muscle weakness (generalized): Secondary | ICD-10-CM | POA: Diagnosis not present

## 2016-08-05 DIAGNOSIS — R296 Repeated falls: Secondary | ICD-10-CM | POA: Diagnosis not present

## 2016-08-05 DIAGNOSIS — M6281 Muscle weakness (generalized): Secondary | ICD-10-CM | POA: Diagnosis not present

## 2016-08-06 DIAGNOSIS — R296 Repeated falls: Secondary | ICD-10-CM | POA: Diagnosis not present

## 2016-08-06 DIAGNOSIS — M6281 Muscle weakness (generalized): Secondary | ICD-10-CM | POA: Diagnosis not present

## 2016-08-08 DIAGNOSIS — M6281 Muscle weakness (generalized): Secondary | ICD-10-CM | POA: Diagnosis not present

## 2016-08-08 DIAGNOSIS — R296 Repeated falls: Secondary | ICD-10-CM | POA: Diagnosis not present

## 2016-08-12 DIAGNOSIS — R296 Repeated falls: Secondary | ICD-10-CM | POA: Diagnosis not present

## 2016-08-12 DIAGNOSIS — M6281 Muscle weakness (generalized): Secondary | ICD-10-CM | POA: Diagnosis not present

## 2016-08-14 DIAGNOSIS — M6281 Muscle weakness (generalized): Secondary | ICD-10-CM | POA: Diagnosis not present

## 2016-08-14 DIAGNOSIS — R296 Repeated falls: Secondary | ICD-10-CM | POA: Diagnosis not present

## 2016-08-16 DIAGNOSIS — M6281 Muscle weakness (generalized): Secondary | ICD-10-CM | POA: Diagnosis not present

## 2016-08-16 DIAGNOSIS — R296 Repeated falls: Secondary | ICD-10-CM | POA: Diagnosis not present

## 2016-08-19 DIAGNOSIS — M6281 Muscle weakness (generalized): Secondary | ICD-10-CM | POA: Diagnosis not present

## 2016-08-19 DIAGNOSIS — R296 Repeated falls: Secondary | ICD-10-CM | POA: Diagnosis not present

## 2016-08-20 DIAGNOSIS — R296 Repeated falls: Secondary | ICD-10-CM | POA: Diagnosis not present

## 2016-08-20 DIAGNOSIS — M6281 Muscle weakness (generalized): Secondary | ICD-10-CM | POA: Diagnosis not present

## 2016-08-22 DIAGNOSIS — M6281 Muscle weakness (generalized): Secondary | ICD-10-CM | POA: Diagnosis not present

## 2016-08-22 DIAGNOSIS — R296 Repeated falls: Secondary | ICD-10-CM | POA: Diagnosis not present

## 2016-08-27 DIAGNOSIS — R296 Repeated falls: Secondary | ICD-10-CM | POA: Diagnosis not present

## 2016-08-27 DIAGNOSIS — M6281 Muscle weakness (generalized): Secondary | ICD-10-CM | POA: Diagnosis not present

## 2016-08-29 DIAGNOSIS — M6281 Muscle weakness (generalized): Secondary | ICD-10-CM | POA: Diagnosis not present

## 2016-08-29 DIAGNOSIS — R296 Repeated falls: Secondary | ICD-10-CM | POA: Diagnosis not present

## 2016-11-06 DIAGNOSIS — H1132 Conjunctival hemorrhage, left eye: Secondary | ICD-10-CM | POA: Diagnosis not present

## 2017-03-21 DIAGNOSIS — R079 Chest pain, unspecified: Secondary | ICD-10-CM | POA: Diagnosis not present

## 2017-06-12 DIAGNOSIS — M549 Dorsalgia, unspecified: Secondary | ICD-10-CM | POA: Diagnosis not present

## 2017-08-12 IMAGING — CR DG CHEST 2V
2 series · 2 of 2 positions shown · non-contrast
Comparison: CXR 09/07/2015

CLINICAL DATA: Family member hospitalized with flu. Was told to be
evaluated for flu. Low oxygenation at 88%. "To for

EXAM:
CHEST  2 VIEW

[w chest pa]
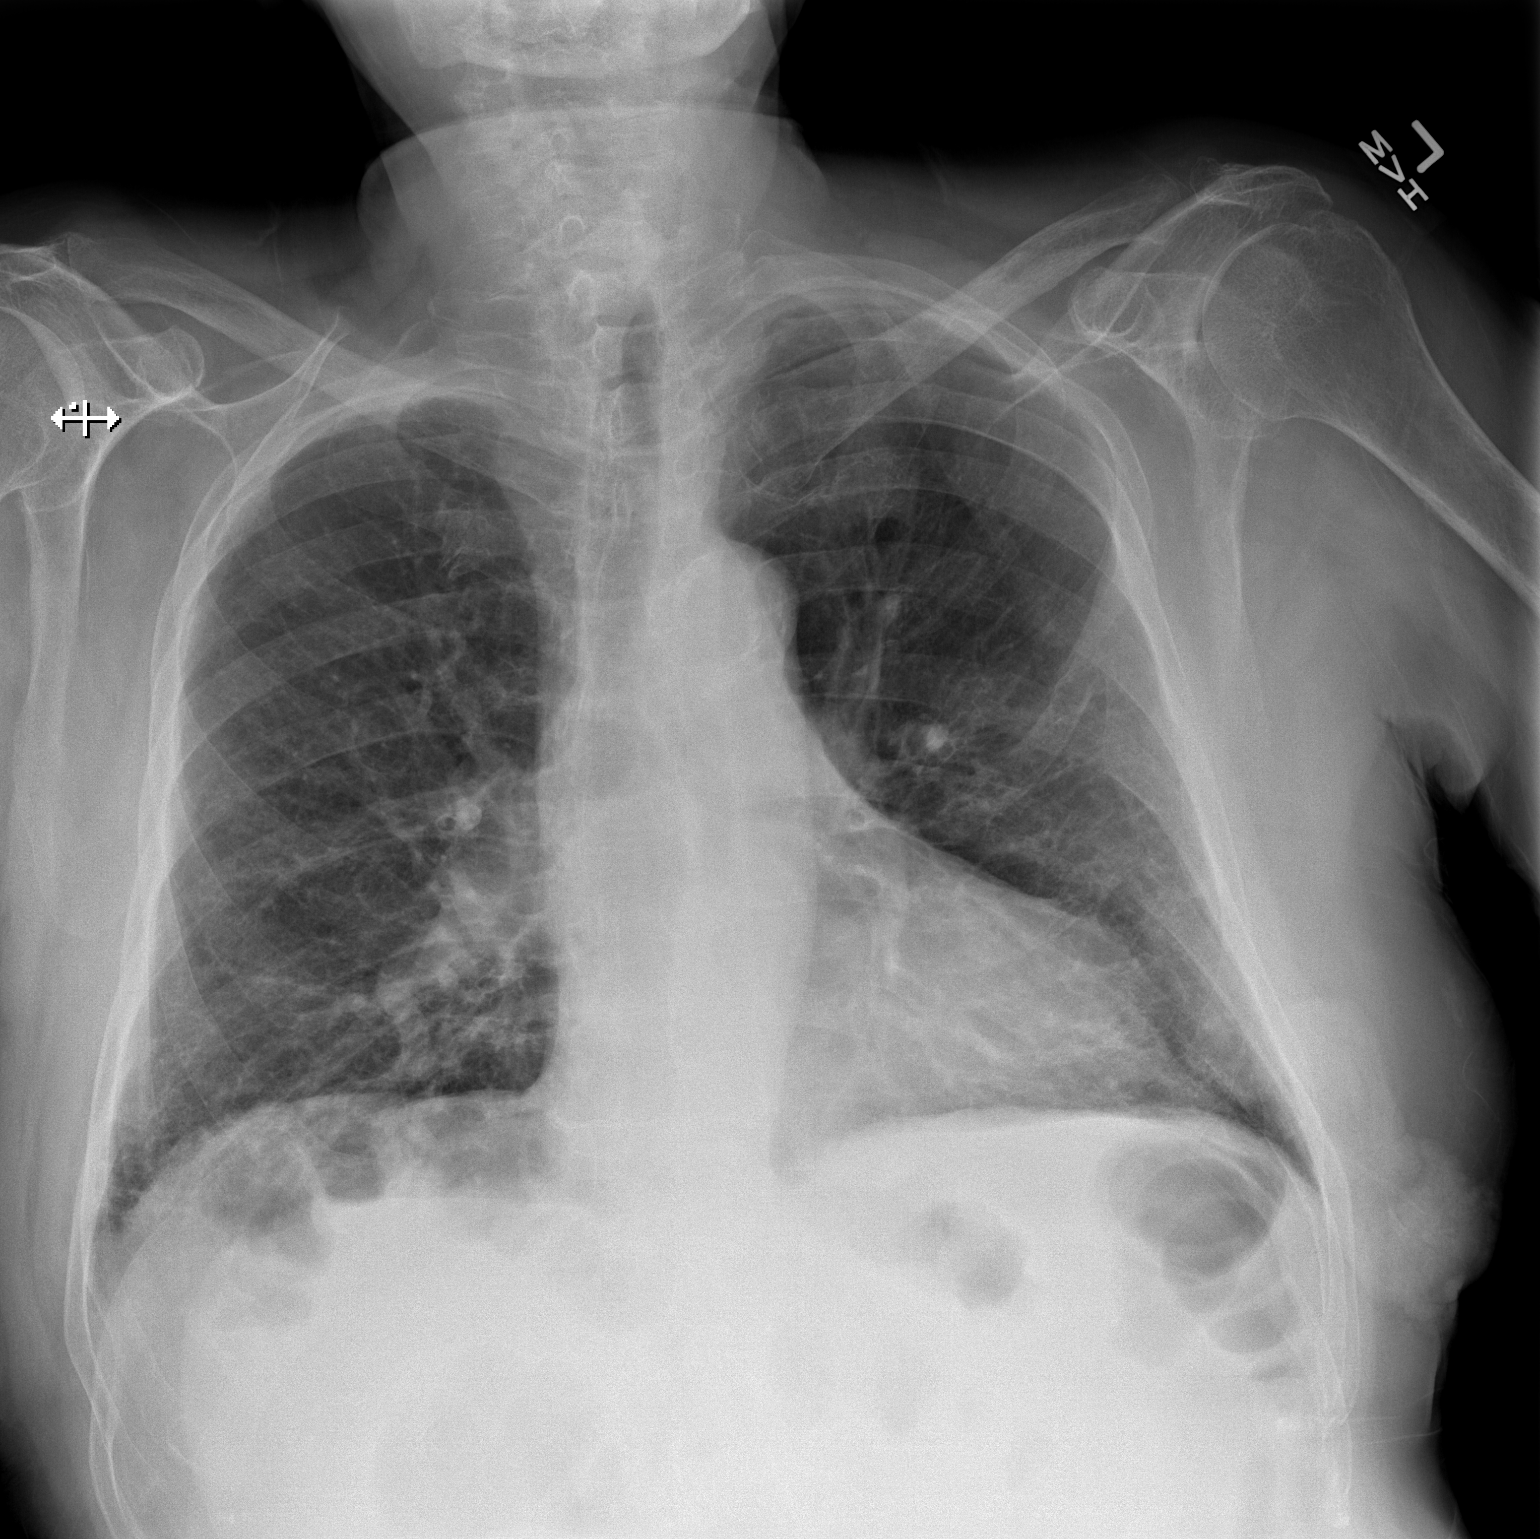

[w chest lat]
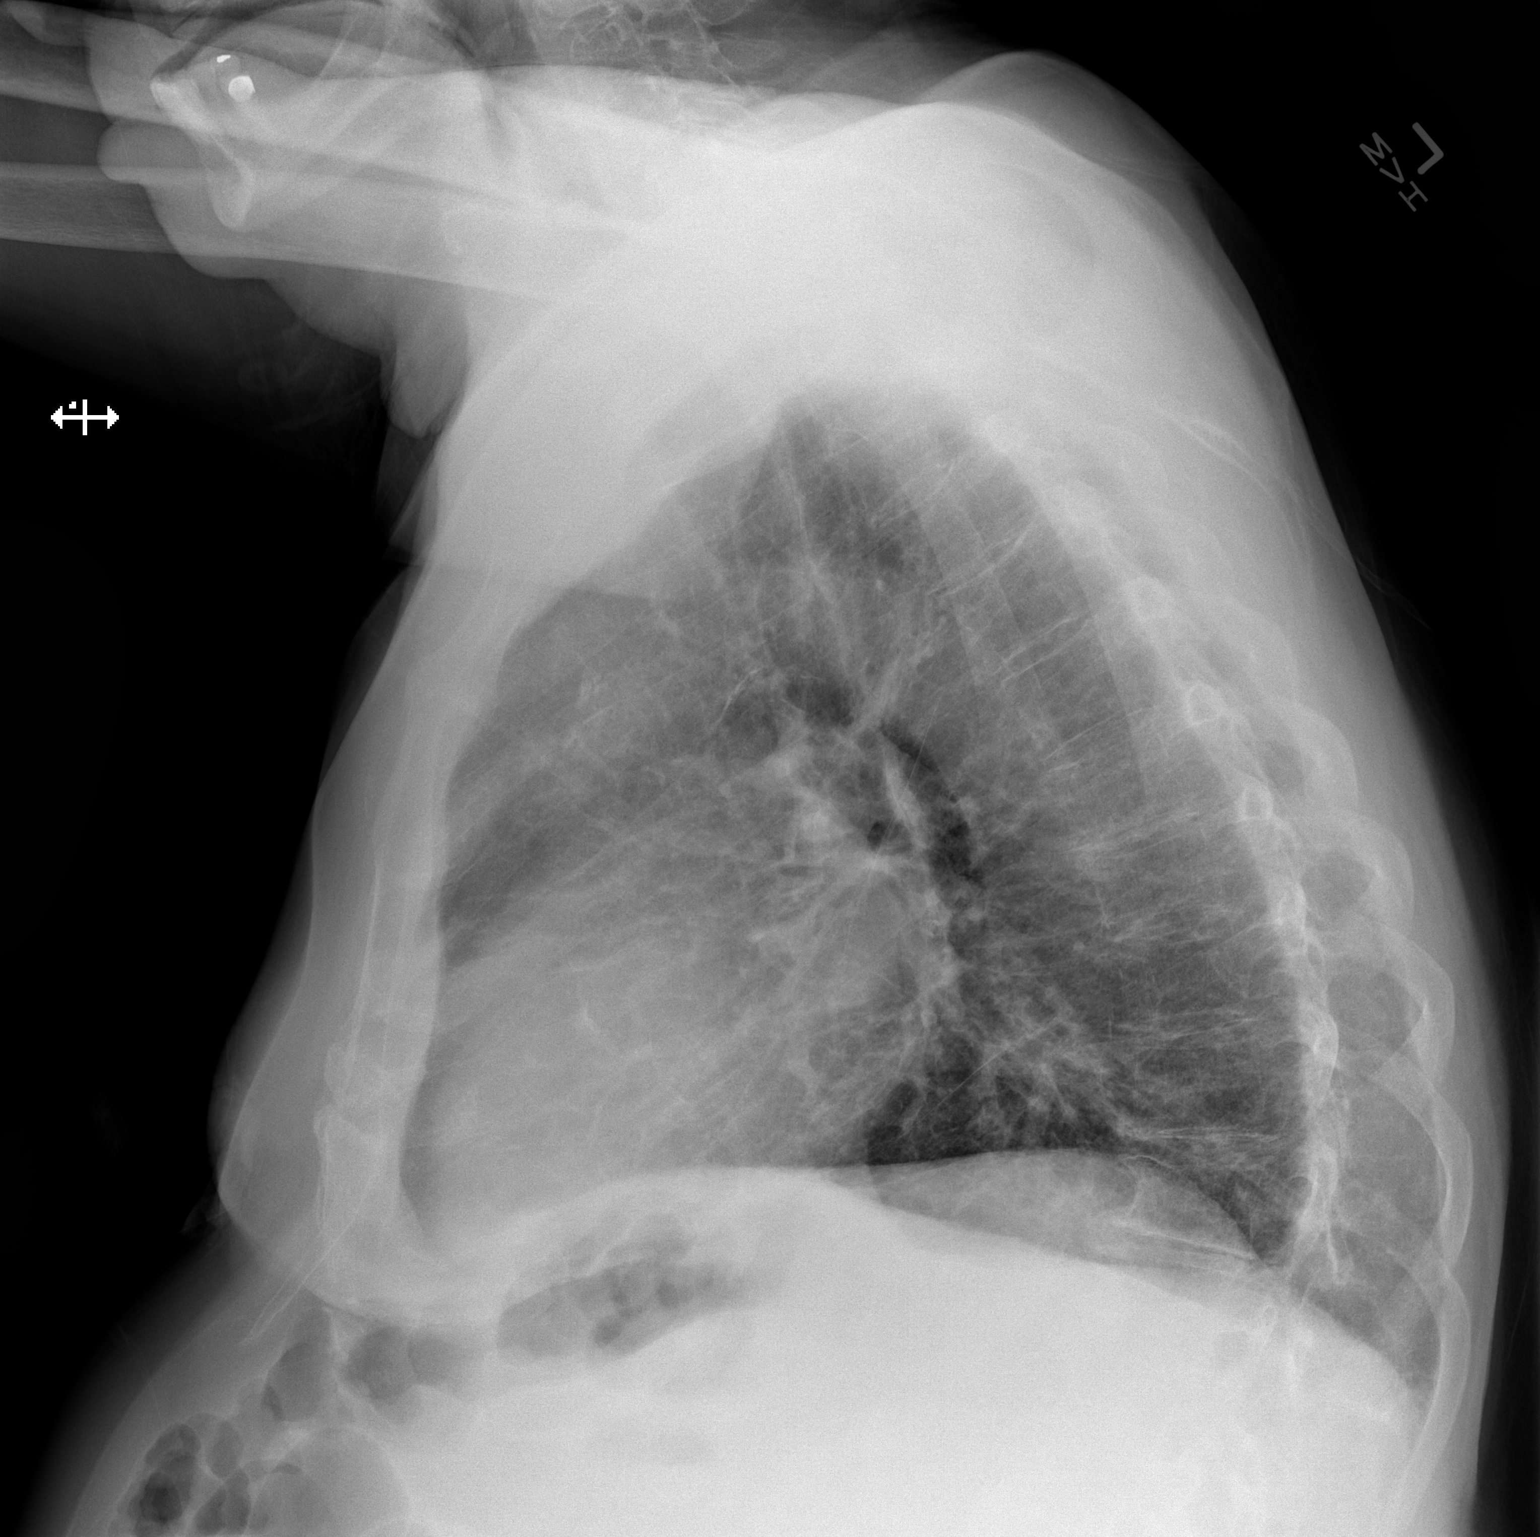

[2 of 2 positions shown; findings below may reference images not displayed]

FINDINGS: Heart is normal in size. There is aortic atherosclerosis without
aneurysm. Mild interstitial fibrosis noted at the lung bases with
minimal atelectasis also likely present. No pneumonic consolidation
is noted. There is no pulmonary edema. No suspicious osseous
abnormalities chronic. Lower thoracic compression deformity is again
seen.
IMPRESSION: Mild interstitial fibrosis is suspected at the lung bases
characterized by fine interstitial lung markings. No acute pulmonary
disease. Aortic atherosclerosis.

## 2017-09-08 DIAGNOSIS — F0150 Vascular dementia without behavioral disturbance: Secondary | ICD-10-CM | POA: Diagnosis not present

## 2017-09-08 DIAGNOSIS — I200 Unstable angina: Secondary | ICD-10-CM | POA: Diagnosis not present

## 2017-09-08 DIAGNOSIS — M129 Arthropathy, unspecified: Secondary | ICD-10-CM | POA: Diagnosis not present

## 2017-09-08 DIAGNOSIS — E782 Mixed hyperlipidemia: Secondary | ICD-10-CM | POA: Diagnosis not present

## 2017-09-08 DIAGNOSIS — I10 Essential (primary) hypertension: Secondary | ICD-10-CM | POA: Diagnosis not present

## 2018-03-04 DIAGNOSIS — Z111 Encounter for screening for respiratory tuberculosis: Secondary | ICD-10-CM | POA: Diagnosis not present

## 2018-03-24 DIAGNOSIS — Z79899 Other long term (current) drug therapy: Secondary | ICD-10-CM | POA: Diagnosis not present

## 2018-03-24 DIAGNOSIS — E785 Hyperlipidemia, unspecified: Secondary | ICD-10-CM | POA: Diagnosis not present

## 2018-03-24 DIAGNOSIS — I10 Essential (primary) hypertension: Secondary | ICD-10-CM | POA: Diagnosis not present

## 2018-03-24 DIAGNOSIS — F0150 Vascular dementia without behavioral disturbance: Secondary | ICD-10-CM | POA: Diagnosis not present

## 2018-03-25 DIAGNOSIS — Z79899 Other long term (current) drug therapy: Secondary | ICD-10-CM | POA: Diagnosis not present

## 2018-04-12 DIAGNOSIS — Z23 Encounter for immunization: Secondary | ICD-10-CM | POA: Diagnosis not present

## 2018-04-14 DIAGNOSIS — R451 Restlessness and agitation: Secondary | ICD-10-CM | POA: Diagnosis not present

## 2018-04-14 DIAGNOSIS — F0150 Vascular dementia without behavioral disturbance: Secondary | ICD-10-CM | POA: Diagnosis not present

## 2018-04-14 DIAGNOSIS — R3981 Functional urinary incontinence: Secondary | ICD-10-CM | POA: Diagnosis not present

## 2018-04-14 DIAGNOSIS — Z79899 Other long term (current) drug therapy: Secondary | ICD-10-CM | POA: Diagnosis not present

## 2018-04-28 DIAGNOSIS — F0150 Vascular dementia without behavioral disturbance: Secondary | ICD-10-CM | POA: Diagnosis not present

## 2018-04-28 DIAGNOSIS — R451 Restlessness and agitation: Secondary | ICD-10-CM | POA: Diagnosis not present

## 2018-04-28 DIAGNOSIS — Z79899 Other long term (current) drug therapy: Secondary | ICD-10-CM | POA: Diagnosis not present

## 2018-05-26 DIAGNOSIS — M546 Pain in thoracic spine: Secondary | ICD-10-CM | POA: Diagnosis not present

## 2018-05-26 DIAGNOSIS — M48061 Spinal stenosis, lumbar region without neurogenic claudication: Secondary | ICD-10-CM | POA: Diagnosis present

## 2018-05-26 DIAGNOSIS — R402243 Coma scale, best verbal response, confused conversation, at hospital admission: Secondary | ICD-10-CM | POA: Diagnosis present

## 2018-05-26 DIAGNOSIS — M479 Spondylosis, unspecified: Secondary | ICD-10-CM | POA: Diagnosis present

## 2018-05-26 DIAGNOSIS — I248 Other forms of acute ischemic heart disease: Secondary | ICD-10-CM | POA: Diagnosis present

## 2018-05-26 DIAGNOSIS — M6281 Muscle weakness (generalized): Secondary | ICD-10-CM | POA: Diagnosis not present

## 2018-05-26 DIAGNOSIS — R627 Adult failure to thrive: Secondary | ICD-10-CM | POA: Diagnosis present

## 2018-05-26 DIAGNOSIS — R402133 Coma scale, eyes open, to sound, at hospital admission: Secondary | ICD-10-CM | POA: Diagnosis present

## 2018-05-26 DIAGNOSIS — M545 Low back pain: Secondary | ICD-10-CM | POA: Diagnosis not present

## 2018-05-26 DIAGNOSIS — Z87891 Personal history of nicotine dependence: Secondary | ICD-10-CM | POA: Diagnosis not present

## 2018-05-26 DIAGNOSIS — E860 Dehydration: Secondary | ICD-10-CM | POA: Diagnosis present

## 2018-05-26 DIAGNOSIS — R531 Weakness: Secondary | ICD-10-CM | POA: Diagnosis not present

## 2018-05-26 DIAGNOSIS — J181 Lobar pneumonia, unspecified organism: Secondary | ICD-10-CM | POA: Diagnosis not present

## 2018-05-26 DIAGNOSIS — D696 Thrombocytopenia, unspecified: Secondary | ICD-10-CM | POA: Diagnosis present

## 2018-05-26 DIAGNOSIS — R4182 Altered mental status, unspecified: Secondary | ICD-10-CM | POA: Diagnosis not present

## 2018-05-26 DIAGNOSIS — R402353 Coma scale, best motor response, localizes pain, at hospital admission: Secondary | ICD-10-CM | POA: Diagnosis present

## 2018-05-26 DIAGNOSIS — R401 Stupor: Secondary | ICD-10-CM | POA: Diagnosis not present

## 2018-05-26 DIAGNOSIS — G9341 Metabolic encephalopathy: Secondary | ICD-10-CM | POA: Diagnosis present

## 2018-05-26 DIAGNOSIS — M542 Cervicalgia: Secondary | ICD-10-CM | POA: Diagnosis not present

## 2018-05-26 DIAGNOSIS — R419 Unspecified symptoms and signs involving cognitive functions and awareness: Secondary | ICD-10-CM | POA: Diagnosis not present

## 2018-05-26 DIAGNOSIS — S32000D Wedge compression fracture of unspecified lumbar vertebra, subsequent encounter for fracture with routine healing: Secondary | ICD-10-CM | POA: Diagnosis not present

## 2018-05-26 DIAGNOSIS — F329 Major depressive disorder, single episode, unspecified: Secondary | ICD-10-CM | POA: Diagnosis present

## 2018-05-26 DIAGNOSIS — Z66 Do not resuscitate: Secondary | ICD-10-CM | POA: Diagnosis present

## 2018-05-26 DIAGNOSIS — M4854XA Collapsed vertebra, not elsewhere classified, thoracic region, initial encounter for fracture: Secondary | ICD-10-CM | POA: Diagnosis present

## 2018-05-26 DIAGNOSIS — S22080D Wedge compression fracture of T11-T12 vertebra, subsequent encounter for fracture with routine healing: Secondary | ICD-10-CM | POA: Diagnosis not present

## 2018-05-26 DIAGNOSIS — I10 Essential (primary) hypertension: Secondary | ICD-10-CM | POA: Diagnosis present

## 2018-05-26 DIAGNOSIS — S199XXA Unspecified injury of neck, initial encounter: Secondary | ICD-10-CM | POA: Diagnosis not present

## 2018-05-26 DIAGNOSIS — M19012 Primary osteoarthritis, left shoulder: Secondary | ICD-10-CM | POA: Diagnosis not present

## 2018-05-26 DIAGNOSIS — S32020D Wedge compression fracture of second lumbar vertebra, subsequent encounter for fracture with routine healing: Secondary | ICD-10-CM | POA: Diagnosis not present

## 2018-05-26 DIAGNOSIS — R638 Other symptoms and signs concerning food and fluid intake: Secondary | ICD-10-CM | POA: Diagnosis not present

## 2018-05-26 DIAGNOSIS — S32030D Wedge compression fracture of third lumbar vertebra, subsequent encounter for fracture with routine healing: Secondary | ICD-10-CM | POA: Diagnosis not present

## 2018-05-26 DIAGNOSIS — M48062 Spinal stenosis, lumbar region with neurogenic claudication: Secondary | ICD-10-CM | POA: Diagnosis not present

## 2018-05-26 DIAGNOSIS — G301 Alzheimer's disease with late onset: Secondary | ICD-10-CM | POA: Diagnosis not present

## 2018-05-26 DIAGNOSIS — R41841 Cognitive communication deficit: Secondary | ICD-10-CM | POA: Diagnosis not present

## 2018-05-26 DIAGNOSIS — R262 Difficulty in walking, not elsewhere classified: Secondary | ICD-10-CM | POA: Diagnosis not present

## 2018-05-26 DIAGNOSIS — M5134 Other intervertebral disc degeneration, thoracic region: Secondary | ICD-10-CM | POA: Diagnosis not present

## 2018-05-26 DIAGNOSIS — R630 Anorexia: Secondary | ICD-10-CM | POA: Diagnosis present

## 2018-05-26 DIAGNOSIS — R1312 Dysphagia, oropharyngeal phase: Secondary | ICD-10-CM | POA: Diagnosis not present

## 2018-05-26 DIAGNOSIS — J189 Pneumonia, unspecified organism: Secondary | ICD-10-CM | POA: Diagnosis not present

## 2018-05-26 DIAGNOSIS — F0280 Dementia in other diseases classified elsewhere without behavioral disturbance: Secondary | ICD-10-CM | POA: Diagnosis present

## 2018-05-26 DIAGNOSIS — M4856XA Collapsed vertebra, not elsewhere classified, lumbar region, initial encounter for fracture: Secondary | ICD-10-CM | POA: Diagnosis present

## 2018-05-26 DIAGNOSIS — E785 Hyperlipidemia, unspecified: Secondary | ICD-10-CM | POA: Diagnosis present

## 2018-06-01 DIAGNOSIS — G9341 Metabolic encephalopathy: Secondary | ICD-10-CM | POA: Diagnosis not present

## 2018-06-01 DIAGNOSIS — R419 Unspecified symptoms and signs involving cognitive functions and awareness: Secondary | ICD-10-CM | POA: Diagnosis not present

## 2018-06-01 DIAGNOSIS — S22080D Wedge compression fracture of T11-T12 vertebra, subsequent encounter for fracture with routine healing: Secondary | ICD-10-CM | POA: Diagnosis not present

## 2018-06-01 DIAGNOSIS — Z87891 Personal history of nicotine dependence: Secondary | ICD-10-CM | POA: Diagnosis not present

## 2018-06-01 DIAGNOSIS — M8088XD Other osteoporosis with current pathological fracture, vertebra(e), subsequent encounter for fracture with routine healing: Secondary | ICD-10-CM | POA: Diagnosis not present

## 2018-06-01 DIAGNOSIS — R5381 Other malaise: Secondary | ICD-10-CM | POA: Diagnosis not present

## 2018-06-01 DIAGNOSIS — G301 Alzheimer's disease with late onset: Secondary | ICD-10-CM | POA: Diagnosis not present

## 2018-06-01 DIAGNOSIS — S32020D Wedge compression fracture of second lumbar vertebra, subsequent encounter for fracture with routine healing: Secondary | ICD-10-CM | POA: Diagnosis not present

## 2018-06-01 DIAGNOSIS — G309 Alzheimer's disease, unspecified: Secondary | ICD-10-CM | POA: Diagnosis not present

## 2018-06-01 DIAGNOSIS — I10 Essential (primary) hypertension: Secondary | ICD-10-CM | POA: Diagnosis not present

## 2018-06-01 DIAGNOSIS — E785 Hyperlipidemia, unspecified: Secondary | ICD-10-CM | POA: Diagnosis not present

## 2018-06-01 DIAGNOSIS — R41841 Cognitive communication deficit: Secondary | ICD-10-CM | POA: Diagnosis not present

## 2018-06-01 DIAGNOSIS — S32030D Wedge compression fracture of third lumbar vertebra, subsequent encounter for fracture with routine healing: Secondary | ICD-10-CM | POA: Diagnosis not present

## 2018-06-01 DIAGNOSIS — M6281 Muscle weakness (generalized): Secondary | ICD-10-CM | POA: Diagnosis not present

## 2018-06-01 DIAGNOSIS — R262 Difficulty in walking, not elsewhere classified: Secondary | ICD-10-CM | POA: Diagnosis not present

## 2018-06-01 DIAGNOSIS — W19XXXD Unspecified fall, subsequent encounter: Secondary | ICD-10-CM | POA: Diagnosis not present

## 2018-06-01 DIAGNOSIS — E860 Dehydration: Secondary | ICD-10-CM | POA: Diagnosis not present

## 2018-06-01 DIAGNOSIS — R627 Adult failure to thrive: Secondary | ICD-10-CM | POA: Diagnosis not present

## 2018-06-01 DIAGNOSIS — F329 Major depressive disorder, single episode, unspecified: Secondary | ICD-10-CM | POA: Diagnosis not present

## 2018-06-01 DIAGNOSIS — M48061 Spinal stenosis, lumbar region without neurogenic claudication: Secondary | ICD-10-CM | POA: Diagnosis not present

## 2018-06-01 DIAGNOSIS — R531 Weakness: Secondary | ICD-10-CM | POA: Diagnosis not present

## 2018-06-01 DIAGNOSIS — Z66 Do not resuscitate: Secondary | ICD-10-CM | POA: Diagnosis not present

## 2018-06-01 DIAGNOSIS — F0280 Dementia in other diseases classified elsewhere without behavioral disturbance: Secondary | ICD-10-CM | POA: Diagnosis not present

## 2018-06-01 DIAGNOSIS — M545 Low back pain: Secondary | ICD-10-CM | POA: Diagnosis not present

## 2018-06-01 DIAGNOSIS — M48062 Spinal stenosis, lumbar region with neurogenic claudication: Secondary | ICD-10-CM | POA: Diagnosis not present

## 2018-06-01 DIAGNOSIS — S32000D Wedge compression fracture of unspecified lumbar vertebra, subsequent encounter for fracture with routine healing: Secondary | ICD-10-CM | POA: Diagnosis not present

## 2018-06-01 DIAGNOSIS — R52 Pain, unspecified: Secondary | ICD-10-CM | POA: Diagnosis not present

## 2018-06-01 DIAGNOSIS — D696 Thrombocytopenia, unspecified: Secondary | ICD-10-CM | POA: Diagnosis not present

## 2018-06-01 DIAGNOSIS — Z5189 Encounter for other specified aftercare: Secondary | ICD-10-CM | POA: Diagnosis not present

## 2018-06-01 DIAGNOSIS — J189 Pneumonia, unspecified organism: Secondary | ICD-10-CM | POA: Diagnosis not present

## 2018-06-01 DIAGNOSIS — J181 Lobar pneumonia, unspecified organism: Secondary | ICD-10-CM | POA: Diagnosis not present

## 2018-06-01 DIAGNOSIS — I248 Other forms of acute ischemic heart disease: Secondary | ICD-10-CM | POA: Diagnosis not present

## 2018-06-01 DIAGNOSIS — R638 Other symptoms and signs concerning food and fluid intake: Secondary | ICD-10-CM | POA: Diagnosis not present

## 2018-06-01 DIAGNOSIS — M62838 Other muscle spasm: Secondary | ICD-10-CM | POA: Diagnosis not present

## 2018-06-01 DIAGNOSIS — R1312 Dysphagia, oropharyngeal phase: Secondary | ICD-10-CM | POA: Diagnosis not present

## 2018-06-01 DIAGNOSIS — F0390 Unspecified dementia without behavioral disturbance: Secondary | ICD-10-CM | POA: Diagnosis not present

## 2018-06-02 DIAGNOSIS — W19XXXD Unspecified fall, subsequent encounter: Secondary | ICD-10-CM | POA: Diagnosis not present

## 2018-06-02 DIAGNOSIS — R5381 Other malaise: Secondary | ICD-10-CM | POA: Diagnosis not present

## 2018-06-02 DIAGNOSIS — M545 Low back pain: Secondary | ICD-10-CM | POA: Diagnosis not present

## 2018-06-02 DIAGNOSIS — M62838 Other muscle spasm: Secondary | ICD-10-CM | POA: Diagnosis not present

## 2018-06-02 DIAGNOSIS — Z5189 Encounter for other specified aftercare: Secondary | ICD-10-CM | POA: Diagnosis not present

## 2018-06-02 DIAGNOSIS — M6281 Muscle weakness (generalized): Secondary | ICD-10-CM | POA: Diagnosis not present

## 2018-06-02 DIAGNOSIS — S22080D Wedge compression fracture of T11-T12 vertebra, subsequent encounter for fracture with routine healing: Secondary | ICD-10-CM | POA: Diagnosis not present

## 2018-06-02 DIAGNOSIS — R262 Difficulty in walking, not elsewhere classified: Secondary | ICD-10-CM | POA: Diagnosis not present

## 2018-06-02 DIAGNOSIS — M8088XD Other osteoporosis with current pathological fracture, vertebra(e), subsequent encounter for fracture with routine healing: Secondary | ICD-10-CM | POA: Diagnosis not present

## 2018-06-02 DIAGNOSIS — F0390 Unspecified dementia without behavioral disturbance: Secondary | ICD-10-CM | POA: Diagnosis not present

## 2018-06-02 DIAGNOSIS — R52 Pain, unspecified: Secondary | ICD-10-CM | POA: Diagnosis not present

## 2018-06-03 DIAGNOSIS — M48061 Spinal stenosis, lumbar region without neurogenic claudication: Secondary | ICD-10-CM | POA: Diagnosis not present

## 2018-06-03 DIAGNOSIS — Z87891 Personal history of nicotine dependence: Secondary | ICD-10-CM | POA: Diagnosis not present

## 2018-06-03 DIAGNOSIS — E785 Hyperlipidemia, unspecified: Secondary | ICD-10-CM | POA: Diagnosis not present

## 2018-06-03 DIAGNOSIS — R5381 Other malaise: Secondary | ICD-10-CM | POA: Diagnosis not present

## 2018-06-03 DIAGNOSIS — Z66 Do not resuscitate: Secondary | ICD-10-CM | POA: Diagnosis not present

## 2018-06-03 DIAGNOSIS — G301 Alzheimer's disease with late onset: Secondary | ICD-10-CM | POA: Diagnosis not present

## 2018-06-03 DIAGNOSIS — G309 Alzheimer's disease, unspecified: Secondary | ICD-10-CM | POA: Diagnosis not present

## 2018-06-03 DIAGNOSIS — I10 Essential (primary) hypertension: Secondary | ICD-10-CM | POA: Diagnosis not present

## 2018-06-03 DIAGNOSIS — F329 Major depressive disorder, single episode, unspecified: Secondary | ICD-10-CM | POA: Diagnosis not present

## 2018-06-03 DIAGNOSIS — F0280 Dementia in other diseases classified elsewhere without behavioral disturbance: Secondary | ICD-10-CM | POA: Diagnosis not present

## 2018-06-05 DIAGNOSIS — M62838 Other muscle spasm: Secondary | ICD-10-CM | POA: Diagnosis not present

## 2018-06-05 DIAGNOSIS — S22080D Wedge compression fracture of T11-T12 vertebra, subsequent encounter for fracture with routine healing: Secondary | ICD-10-CM | POA: Diagnosis not present

## 2018-06-05 DIAGNOSIS — R5381 Other malaise: Secondary | ICD-10-CM | POA: Diagnosis not present

## 2018-06-05 DIAGNOSIS — F0390 Unspecified dementia without behavioral disturbance: Secondary | ICD-10-CM | POA: Diagnosis not present

## 2018-06-05 DIAGNOSIS — M545 Low back pain: Secondary | ICD-10-CM | POA: Diagnosis not present

## 2018-06-05 DIAGNOSIS — R52 Pain, unspecified: Secondary | ICD-10-CM | POA: Diagnosis not present

## 2018-06-05 DIAGNOSIS — M8088XD Other osteoporosis with current pathological fracture, vertebra(e), subsequent encounter for fracture with routine healing: Secondary | ICD-10-CM | POA: Diagnosis not present

## 2018-06-05 DIAGNOSIS — W19XXXD Unspecified fall, subsequent encounter: Secondary | ICD-10-CM | POA: Diagnosis not present

## 2018-06-05 DIAGNOSIS — M6281 Muscle weakness (generalized): Secondary | ICD-10-CM | POA: Diagnosis not present

## 2018-06-05 DIAGNOSIS — R262 Difficulty in walking, not elsewhere classified: Secondary | ICD-10-CM | POA: Diagnosis not present

## 2018-06-05 DIAGNOSIS — Z5189 Encounter for other specified aftercare: Secondary | ICD-10-CM | POA: Diagnosis not present

## 2018-06-08 DIAGNOSIS — M6281 Muscle weakness (generalized): Secondary | ICD-10-CM | POA: Diagnosis not present

## 2018-06-08 DIAGNOSIS — F0390 Unspecified dementia without behavioral disturbance: Secondary | ICD-10-CM | POA: Diagnosis not present

## 2018-06-08 DIAGNOSIS — S22080D Wedge compression fracture of T11-T12 vertebra, subsequent encounter for fracture with routine healing: Secondary | ICD-10-CM | POA: Diagnosis not present

## 2018-06-08 DIAGNOSIS — M545 Low back pain: Secondary | ICD-10-CM | POA: Diagnosis not present

## 2018-06-08 DIAGNOSIS — R262 Difficulty in walking, not elsewhere classified: Secondary | ICD-10-CM | POA: Diagnosis not present

## 2018-06-08 DIAGNOSIS — M62838 Other muscle spasm: Secondary | ICD-10-CM | POA: Diagnosis not present

## 2018-06-08 DIAGNOSIS — W19XXXD Unspecified fall, subsequent encounter: Secondary | ICD-10-CM | POA: Diagnosis not present

## 2018-06-08 DIAGNOSIS — M8088XD Other osteoporosis with current pathological fracture, vertebra(e), subsequent encounter for fracture with routine healing: Secondary | ICD-10-CM | POA: Diagnosis not present

## 2018-06-08 DIAGNOSIS — R52 Pain, unspecified: Secondary | ICD-10-CM | POA: Diagnosis not present

## 2018-06-08 DIAGNOSIS — Z5189 Encounter for other specified aftercare: Secondary | ICD-10-CM | POA: Diagnosis not present

## 2018-06-08 DIAGNOSIS — R5381 Other malaise: Secondary | ICD-10-CM | POA: Diagnosis not present

## 2018-06-12 DIAGNOSIS — Z87891 Personal history of nicotine dependence: Secondary | ICD-10-CM | POA: Diagnosis not present

## 2018-06-12 DIAGNOSIS — F0280 Dementia in other diseases classified elsewhere without behavioral disturbance: Secondary | ICD-10-CM | POA: Diagnosis not present

## 2018-06-12 DIAGNOSIS — I10 Essential (primary) hypertension: Secondary | ICD-10-CM | POA: Diagnosis not present

## 2018-06-12 DIAGNOSIS — R531 Weakness: Secondary | ICD-10-CM | POA: Diagnosis not present

## 2018-06-12 DIAGNOSIS — E785 Hyperlipidemia, unspecified: Secondary | ICD-10-CM | POA: Diagnosis not present

## 2018-06-12 DIAGNOSIS — G301 Alzheimer's disease with late onset: Secondary | ICD-10-CM | POA: Diagnosis not present

## 2018-06-16 DIAGNOSIS — J189 Pneumonia, unspecified organism: Secondary | ICD-10-CM | POA: Diagnosis not present

## 2018-06-16 DIAGNOSIS — E785 Hyperlipidemia, unspecified: Secondary | ICD-10-CM | POA: Diagnosis not present

## 2018-06-16 DIAGNOSIS — F329 Major depressive disorder, single episode, unspecified: Secondary | ICD-10-CM | POA: Diagnosis not present

## 2018-06-16 DIAGNOSIS — E569 Vitamin deficiency, unspecified: Secondary | ICD-10-CM | POA: Diagnosis not present

## 2018-06-16 DIAGNOSIS — R262 Difficulty in walking, not elsewhere classified: Secondary | ICD-10-CM | POA: Diagnosis not present

## 2018-06-16 DIAGNOSIS — Z79899 Other long term (current) drug therapy: Secondary | ICD-10-CM | POA: Diagnosis not present

## 2018-06-16 DIAGNOSIS — G301 Alzheimer's disease with late onset: Secondary | ICD-10-CM | POA: Diagnosis not present

## 2018-06-16 DIAGNOSIS — M150 Primary generalized (osteo)arthritis: Secondary | ICD-10-CM | POA: Diagnosis not present

## 2018-06-16 DIAGNOSIS — F0280 Dementia in other diseases classified elsewhere without behavioral disturbance: Secondary | ICD-10-CM | POA: Diagnosis not present

## 2018-06-16 DIAGNOSIS — F0150 Vascular dementia without behavioral disturbance: Secondary | ICD-10-CM | POA: Diagnosis not present

## 2018-06-16 DIAGNOSIS — I249 Acute ischemic heart disease, unspecified: Secondary | ICD-10-CM | POA: Diagnosis not present

## 2018-06-16 DIAGNOSIS — J181 Lobar pneumonia, unspecified organism: Secondary | ICD-10-CM | POA: Diagnosis not present

## 2018-06-16 DIAGNOSIS — S32000D Wedge compression fracture of unspecified lumbar vertebra, subsequent encounter for fracture with routine healing: Secondary | ICD-10-CM | POA: Diagnosis not present

## 2018-06-16 DIAGNOSIS — M48061 Spinal stenosis, lumbar region without neurogenic claudication: Secondary | ICD-10-CM | POA: Diagnosis not present

## 2018-06-16 DIAGNOSIS — I10 Essential (primary) hypertension: Secondary | ICD-10-CM | POA: Diagnosis not present

## 2018-06-16 DIAGNOSIS — M6281 Muscle weakness (generalized): Secondary | ICD-10-CM | POA: Diagnosis not present

## 2018-06-17 DIAGNOSIS — R262 Difficulty in walking, not elsewhere classified: Secondary | ICD-10-CM | POA: Diagnosis not present

## 2018-06-17 DIAGNOSIS — J181 Lobar pneumonia, unspecified organism: Secondary | ICD-10-CM | POA: Diagnosis not present

## 2018-06-17 DIAGNOSIS — M6281 Muscle weakness (generalized): Secondary | ICD-10-CM | POA: Diagnosis not present

## 2018-06-19 DIAGNOSIS — R262 Difficulty in walking, not elsewhere classified: Secondary | ICD-10-CM | POA: Diagnosis not present

## 2018-06-19 DIAGNOSIS — J181 Lobar pneumonia, unspecified organism: Secondary | ICD-10-CM | POA: Diagnosis not present

## 2018-06-19 DIAGNOSIS — M48061 Spinal stenosis, lumbar region without neurogenic claudication: Secondary | ICD-10-CM | POA: Diagnosis not present

## 2018-06-19 DIAGNOSIS — F0280 Dementia in other diseases classified elsewhere without behavioral disturbance: Secondary | ICD-10-CM | POA: Diagnosis not present

## 2018-06-19 DIAGNOSIS — G301 Alzheimer's disease with late onset: Secondary | ICD-10-CM | POA: Diagnosis not present

## 2018-06-19 DIAGNOSIS — M6281 Muscle weakness (generalized): Secondary | ICD-10-CM | POA: Diagnosis not present

## 2018-06-22 DIAGNOSIS — R262 Difficulty in walking, not elsewhere classified: Secondary | ICD-10-CM | POA: Diagnosis not present

## 2018-06-22 DIAGNOSIS — M6281 Muscle weakness (generalized): Secondary | ICD-10-CM | POA: Diagnosis not present

## 2018-06-22 DIAGNOSIS — F0280 Dementia in other diseases classified elsewhere without behavioral disturbance: Secondary | ICD-10-CM | POA: Diagnosis not present

## 2018-06-22 DIAGNOSIS — M48061 Spinal stenosis, lumbar region without neurogenic claudication: Secondary | ICD-10-CM | POA: Diagnosis not present

## 2018-06-22 DIAGNOSIS — J181 Lobar pneumonia, unspecified organism: Secondary | ICD-10-CM | POA: Diagnosis not present

## 2018-06-22 DIAGNOSIS — G301 Alzheimer's disease with late onset: Secondary | ICD-10-CM | POA: Diagnosis not present

## 2018-06-25 DIAGNOSIS — G301 Alzheimer's disease with late onset: Secondary | ICD-10-CM | POA: Diagnosis not present

## 2018-06-25 DIAGNOSIS — M48061 Spinal stenosis, lumbar region without neurogenic claudication: Secondary | ICD-10-CM | POA: Diagnosis not present

## 2018-06-25 DIAGNOSIS — F0280 Dementia in other diseases classified elsewhere without behavioral disturbance: Secondary | ICD-10-CM | POA: Diagnosis not present

## 2018-06-25 DIAGNOSIS — R262 Difficulty in walking, not elsewhere classified: Secondary | ICD-10-CM | POA: Diagnosis not present

## 2018-06-25 DIAGNOSIS — J181 Lobar pneumonia, unspecified organism: Secondary | ICD-10-CM | POA: Diagnosis not present

## 2018-06-25 DIAGNOSIS — M6281 Muscle weakness (generalized): Secondary | ICD-10-CM | POA: Diagnosis not present

## 2018-06-29 DIAGNOSIS — R262 Difficulty in walking, not elsewhere classified: Secondary | ICD-10-CM | POA: Diagnosis not present

## 2018-06-29 DIAGNOSIS — M6281 Muscle weakness (generalized): Secondary | ICD-10-CM | POA: Diagnosis not present

## 2018-06-29 DIAGNOSIS — M48061 Spinal stenosis, lumbar region without neurogenic claudication: Secondary | ICD-10-CM | POA: Diagnosis not present

## 2018-06-29 DIAGNOSIS — G301 Alzheimer's disease with late onset: Secondary | ICD-10-CM | POA: Diagnosis not present

## 2018-06-29 DIAGNOSIS — J181 Lobar pneumonia, unspecified organism: Secondary | ICD-10-CM | POA: Diagnosis not present

## 2018-06-29 DIAGNOSIS — F0280 Dementia in other diseases classified elsewhere without behavioral disturbance: Secondary | ICD-10-CM | POA: Diagnosis not present

## 2018-07-02 DIAGNOSIS — M6281 Muscle weakness (generalized): Secondary | ICD-10-CM | POA: Diagnosis not present

## 2018-07-02 DIAGNOSIS — M48061 Spinal stenosis, lumbar region without neurogenic claudication: Secondary | ICD-10-CM | POA: Diagnosis not present

## 2018-07-02 DIAGNOSIS — R262 Difficulty in walking, not elsewhere classified: Secondary | ICD-10-CM | POA: Diagnosis not present

## 2018-07-02 DIAGNOSIS — G301 Alzheimer's disease with late onset: Secondary | ICD-10-CM | POA: Diagnosis not present

## 2018-07-02 DIAGNOSIS — F0280 Dementia in other diseases classified elsewhere without behavioral disturbance: Secondary | ICD-10-CM | POA: Diagnosis not present

## 2018-07-02 DIAGNOSIS — J181 Lobar pneumonia, unspecified organism: Secondary | ICD-10-CM | POA: Diagnosis not present

## 2018-07-06 DIAGNOSIS — F0280 Dementia in other diseases classified elsewhere without behavioral disturbance: Secondary | ICD-10-CM | POA: Diagnosis not present

## 2018-07-06 DIAGNOSIS — M48061 Spinal stenosis, lumbar region without neurogenic claudication: Secondary | ICD-10-CM | POA: Diagnosis not present

## 2018-07-06 DIAGNOSIS — J181 Lobar pneumonia, unspecified organism: Secondary | ICD-10-CM | POA: Diagnosis not present

## 2018-07-06 DIAGNOSIS — M6281 Muscle weakness (generalized): Secondary | ICD-10-CM | POA: Diagnosis not present

## 2018-07-06 DIAGNOSIS — G301 Alzheimer's disease with late onset: Secondary | ICD-10-CM | POA: Diagnosis not present

## 2018-07-06 DIAGNOSIS — R262 Difficulty in walking, not elsewhere classified: Secondary | ICD-10-CM | POA: Diagnosis not present

## 2018-07-07 DIAGNOSIS — R451 Restlessness and agitation: Secondary | ICD-10-CM | POA: Diagnosis not present

## 2018-07-07 DIAGNOSIS — Z79899 Other long term (current) drug therapy: Secondary | ICD-10-CM | POA: Diagnosis not present

## 2018-07-07 DIAGNOSIS — F0151 Vascular dementia with behavioral disturbance: Secondary | ICD-10-CM | POA: Diagnosis not present

## 2018-07-08 DIAGNOSIS — M6281 Muscle weakness (generalized): Secondary | ICD-10-CM | POA: Diagnosis not present

## 2018-07-08 DIAGNOSIS — G301 Alzheimer's disease with late onset: Secondary | ICD-10-CM | POA: Diagnosis not present

## 2018-07-08 DIAGNOSIS — J181 Lobar pneumonia, unspecified organism: Secondary | ICD-10-CM | POA: Diagnosis not present

## 2018-07-08 DIAGNOSIS — R262 Difficulty in walking, not elsewhere classified: Secondary | ICD-10-CM | POA: Diagnosis not present

## 2018-07-08 DIAGNOSIS — M48061 Spinal stenosis, lumbar region without neurogenic claudication: Secondary | ICD-10-CM | POA: Diagnosis not present

## 2018-07-08 DIAGNOSIS — F0280 Dementia in other diseases classified elsewhere without behavioral disturbance: Secondary | ICD-10-CM | POA: Diagnosis not present

## 2018-07-13 DIAGNOSIS — M48061 Spinal stenosis, lumbar region without neurogenic claudication: Secondary | ICD-10-CM | POA: Diagnosis not present

## 2018-07-13 DIAGNOSIS — F0280 Dementia in other diseases classified elsewhere without behavioral disturbance: Secondary | ICD-10-CM | POA: Diagnosis not present

## 2018-07-13 DIAGNOSIS — J181 Lobar pneumonia, unspecified organism: Secondary | ICD-10-CM | POA: Diagnosis not present

## 2018-07-13 DIAGNOSIS — G301 Alzheimer's disease with late onset: Secondary | ICD-10-CM | POA: Diagnosis not present

## 2018-07-13 DIAGNOSIS — M6281 Muscle weakness (generalized): Secondary | ICD-10-CM | POA: Diagnosis not present

## 2018-07-13 DIAGNOSIS — R262 Difficulty in walking, not elsewhere classified: Secondary | ICD-10-CM | POA: Diagnosis not present

## 2018-07-14 DIAGNOSIS — R2681 Unsteadiness on feet: Secondary | ICD-10-CM | POA: Diagnosis not present

## 2018-07-14 DIAGNOSIS — F0151 Vascular dementia with behavioral disturbance: Secondary | ICD-10-CM | POA: Diagnosis not present

## 2018-07-14 DIAGNOSIS — Z79899 Other long term (current) drug therapy: Secondary | ICD-10-CM | POA: Diagnosis not present

## 2018-07-15 DIAGNOSIS — M6281 Muscle weakness (generalized): Secondary | ICD-10-CM | POA: Diagnosis not present

## 2018-07-15 DIAGNOSIS — N390 Urinary tract infection, site not specified: Secondary | ICD-10-CM | POA: Diagnosis not present

## 2018-07-15 DIAGNOSIS — J181 Lobar pneumonia, unspecified organism: Secondary | ICD-10-CM | POA: Diagnosis not present

## 2018-07-15 DIAGNOSIS — R262 Difficulty in walking, not elsewhere classified: Secondary | ICD-10-CM | POA: Diagnosis not present

## 2018-07-15 DIAGNOSIS — F0280 Dementia in other diseases classified elsewhere without behavioral disturbance: Secondary | ICD-10-CM | POA: Diagnosis not present

## 2018-07-15 DIAGNOSIS — G301 Alzheimer's disease with late onset: Secondary | ICD-10-CM | POA: Diagnosis not present

## 2018-07-15 DIAGNOSIS — M48061 Spinal stenosis, lumbar region without neurogenic claudication: Secondary | ICD-10-CM | POA: Diagnosis not present

## 2018-07-17 DIAGNOSIS — M6281 Muscle weakness (generalized): Secondary | ICD-10-CM | POA: Diagnosis not present

## 2018-07-17 DIAGNOSIS — G301 Alzheimer's disease with late onset: Secondary | ICD-10-CM | POA: Diagnosis not present

## 2018-07-17 DIAGNOSIS — F0280 Dementia in other diseases classified elsewhere without behavioral disturbance: Secondary | ICD-10-CM | POA: Diagnosis not present

## 2018-07-17 DIAGNOSIS — J181 Lobar pneumonia, unspecified organism: Secondary | ICD-10-CM | POA: Diagnosis not present

## 2018-07-17 DIAGNOSIS — R262 Difficulty in walking, not elsewhere classified: Secondary | ICD-10-CM | POA: Diagnosis not present

## 2018-07-17 DIAGNOSIS — M48061 Spinal stenosis, lumbar region without neurogenic claudication: Secondary | ICD-10-CM | POA: Diagnosis not present

## 2018-07-22 DIAGNOSIS — F0280 Dementia in other diseases classified elsewhere without behavioral disturbance: Secondary | ICD-10-CM | POA: Diagnosis not present

## 2018-07-22 DIAGNOSIS — M48061 Spinal stenosis, lumbar region without neurogenic claudication: Secondary | ICD-10-CM | POA: Diagnosis not present

## 2018-07-22 DIAGNOSIS — G301 Alzheimer's disease with late onset: Secondary | ICD-10-CM | POA: Diagnosis not present

## 2018-07-22 DIAGNOSIS — R262 Difficulty in walking, not elsewhere classified: Secondary | ICD-10-CM | POA: Diagnosis not present

## 2018-07-22 DIAGNOSIS — J181 Lobar pneumonia, unspecified organism: Secondary | ICD-10-CM | POA: Diagnosis not present

## 2018-07-22 DIAGNOSIS — M6281 Muscle weakness (generalized): Secondary | ICD-10-CM | POA: Diagnosis not present

## 2018-07-24 DIAGNOSIS — G301 Alzheimer's disease with late onset: Secondary | ICD-10-CM | POA: Diagnosis not present

## 2018-07-24 DIAGNOSIS — J181 Lobar pneumonia, unspecified organism: Secondary | ICD-10-CM | POA: Diagnosis not present

## 2018-07-24 DIAGNOSIS — R262 Difficulty in walking, not elsewhere classified: Secondary | ICD-10-CM | POA: Diagnosis not present

## 2018-07-24 DIAGNOSIS — F0280 Dementia in other diseases classified elsewhere without behavioral disturbance: Secondary | ICD-10-CM | POA: Diagnosis not present

## 2018-07-24 DIAGNOSIS — M48061 Spinal stenosis, lumbar region without neurogenic claudication: Secondary | ICD-10-CM | POA: Diagnosis not present

## 2018-07-24 DIAGNOSIS — M6281 Muscle weakness (generalized): Secondary | ICD-10-CM | POA: Diagnosis not present

## 2018-07-27 DIAGNOSIS — J181 Lobar pneumonia, unspecified organism: Secondary | ICD-10-CM | POA: Diagnosis not present

## 2018-07-27 DIAGNOSIS — M48061 Spinal stenosis, lumbar region without neurogenic claudication: Secondary | ICD-10-CM | POA: Diagnosis not present

## 2018-07-27 DIAGNOSIS — F0280 Dementia in other diseases classified elsewhere without behavioral disturbance: Secondary | ICD-10-CM | POA: Diagnosis not present

## 2018-07-27 DIAGNOSIS — M6281 Muscle weakness (generalized): Secondary | ICD-10-CM | POA: Diagnosis not present

## 2018-07-27 DIAGNOSIS — G301 Alzheimer's disease with late onset: Secondary | ICD-10-CM | POA: Diagnosis not present

## 2018-07-27 DIAGNOSIS — R262 Difficulty in walking, not elsewhere classified: Secondary | ICD-10-CM | POA: Diagnosis not present

## 2018-07-29 DIAGNOSIS — J181 Lobar pneumonia, unspecified organism: Secondary | ICD-10-CM | POA: Diagnosis not present

## 2018-07-29 DIAGNOSIS — R262 Difficulty in walking, not elsewhere classified: Secondary | ICD-10-CM | POA: Diagnosis not present

## 2018-07-29 DIAGNOSIS — F0280 Dementia in other diseases classified elsewhere without behavioral disturbance: Secondary | ICD-10-CM | POA: Diagnosis not present

## 2018-07-29 DIAGNOSIS — G301 Alzheimer's disease with late onset: Secondary | ICD-10-CM | POA: Diagnosis not present

## 2018-07-29 DIAGNOSIS — M6281 Muscle weakness (generalized): Secondary | ICD-10-CM | POA: Diagnosis not present

## 2018-07-29 DIAGNOSIS — M48061 Spinal stenosis, lumbar region without neurogenic claudication: Secondary | ICD-10-CM | POA: Diagnosis not present

## 2018-07-30 DIAGNOSIS — G301 Alzheimer's disease with late onset: Secondary | ICD-10-CM | POA: Diagnosis not present

## 2018-07-30 DIAGNOSIS — M48061 Spinal stenosis, lumbar region without neurogenic claudication: Secondary | ICD-10-CM | POA: Diagnosis not present

## 2018-07-30 DIAGNOSIS — F0280 Dementia in other diseases classified elsewhere without behavioral disturbance: Secondary | ICD-10-CM | POA: Diagnosis not present

## 2018-07-30 DIAGNOSIS — M6281 Muscle weakness (generalized): Secondary | ICD-10-CM | POA: Diagnosis not present

## 2018-07-30 DIAGNOSIS — J181 Lobar pneumonia, unspecified organism: Secondary | ICD-10-CM | POA: Diagnosis not present

## 2018-07-30 DIAGNOSIS — R262 Difficulty in walking, not elsewhere classified: Secondary | ICD-10-CM | POA: Diagnosis not present

## 2018-07-31 DIAGNOSIS — R262 Difficulty in walking, not elsewhere classified: Secondary | ICD-10-CM | POA: Diagnosis not present

## 2018-07-31 DIAGNOSIS — M6281 Muscle weakness (generalized): Secondary | ICD-10-CM | POA: Diagnosis not present

## 2018-07-31 DIAGNOSIS — J181 Lobar pneumonia, unspecified organism: Secondary | ICD-10-CM | POA: Diagnosis not present

## 2018-07-31 DIAGNOSIS — G301 Alzheimer's disease with late onset: Secondary | ICD-10-CM | POA: Diagnosis not present

## 2018-07-31 DIAGNOSIS — F0280 Dementia in other diseases classified elsewhere without behavioral disturbance: Secondary | ICD-10-CM | POA: Diagnosis not present

## 2018-07-31 DIAGNOSIS — M48061 Spinal stenosis, lumbar region without neurogenic claudication: Secondary | ICD-10-CM | POA: Diagnosis not present

## 2018-08-03 DIAGNOSIS — G301 Alzheimer's disease with late onset: Secondary | ICD-10-CM | POA: Diagnosis not present

## 2018-08-03 DIAGNOSIS — R262 Difficulty in walking, not elsewhere classified: Secondary | ICD-10-CM | POA: Diagnosis not present

## 2018-08-03 DIAGNOSIS — M6281 Muscle weakness (generalized): Secondary | ICD-10-CM | POA: Diagnosis not present

## 2018-08-03 DIAGNOSIS — F0280 Dementia in other diseases classified elsewhere without behavioral disturbance: Secondary | ICD-10-CM | POA: Diagnosis not present

## 2018-08-03 DIAGNOSIS — M48061 Spinal stenosis, lumbar region without neurogenic claudication: Secondary | ICD-10-CM | POA: Diagnosis not present

## 2018-08-03 DIAGNOSIS — J181 Lobar pneumonia, unspecified organism: Secondary | ICD-10-CM | POA: Diagnosis not present

## 2018-08-05 DIAGNOSIS — Z79899 Other long term (current) drug therapy: Secondary | ICD-10-CM | POA: Diagnosis not present

## 2018-08-06 DIAGNOSIS — J181 Lobar pneumonia, unspecified organism: Secondary | ICD-10-CM | POA: Diagnosis not present

## 2018-08-06 DIAGNOSIS — G301 Alzheimer's disease with late onset: Secondary | ICD-10-CM | POA: Diagnosis not present

## 2018-08-06 DIAGNOSIS — M48061 Spinal stenosis, lumbar region without neurogenic claudication: Secondary | ICD-10-CM | POA: Diagnosis not present

## 2018-08-06 DIAGNOSIS — M6281 Muscle weakness (generalized): Secondary | ICD-10-CM | POA: Diagnosis not present

## 2018-08-06 DIAGNOSIS — F0280 Dementia in other diseases classified elsewhere without behavioral disturbance: Secondary | ICD-10-CM | POA: Diagnosis not present

## 2018-08-06 DIAGNOSIS — R262 Difficulty in walking, not elsewhere classified: Secondary | ICD-10-CM | POA: Diagnosis not present

## 2018-08-10 DIAGNOSIS — R262 Difficulty in walking, not elsewhere classified: Secondary | ICD-10-CM | POA: Diagnosis not present

## 2018-08-10 DIAGNOSIS — J181 Lobar pneumonia, unspecified organism: Secondary | ICD-10-CM | POA: Diagnosis not present

## 2018-08-10 DIAGNOSIS — M48061 Spinal stenosis, lumbar region without neurogenic claudication: Secondary | ICD-10-CM | POA: Diagnosis not present

## 2018-08-10 DIAGNOSIS — M6281 Muscle weakness (generalized): Secondary | ICD-10-CM | POA: Diagnosis not present

## 2018-08-10 DIAGNOSIS — G301 Alzheimer's disease with late onset: Secondary | ICD-10-CM | POA: Diagnosis not present

## 2018-08-10 DIAGNOSIS — F0280 Dementia in other diseases classified elsewhere without behavioral disturbance: Secondary | ICD-10-CM | POA: Diagnosis not present

## 2018-08-11 DIAGNOSIS — M48061 Spinal stenosis, lumbar region without neurogenic claudication: Secondary | ICD-10-CM | POA: Diagnosis not present

## 2018-08-11 DIAGNOSIS — F0280 Dementia in other diseases classified elsewhere without behavioral disturbance: Secondary | ICD-10-CM | POA: Diagnosis not present

## 2018-08-11 DIAGNOSIS — J181 Lobar pneumonia, unspecified organism: Secondary | ICD-10-CM | POA: Diagnosis not present

## 2018-08-11 DIAGNOSIS — M6281 Muscle weakness (generalized): Secondary | ICD-10-CM | POA: Diagnosis not present

## 2018-08-11 DIAGNOSIS — R262 Difficulty in walking, not elsewhere classified: Secondary | ICD-10-CM | POA: Diagnosis not present

## 2018-08-11 DIAGNOSIS — G301 Alzheimer's disease with late onset: Secondary | ICD-10-CM | POA: Diagnosis not present

## 2018-08-13 DIAGNOSIS — M6281 Muscle weakness (generalized): Secondary | ICD-10-CM | POA: Diagnosis not present

## 2018-08-13 DIAGNOSIS — F0280 Dementia in other diseases classified elsewhere without behavioral disturbance: Secondary | ICD-10-CM | POA: Diagnosis not present

## 2018-08-13 DIAGNOSIS — M48061 Spinal stenosis, lumbar region without neurogenic claudication: Secondary | ICD-10-CM | POA: Diagnosis not present

## 2018-08-13 DIAGNOSIS — R262 Difficulty in walking, not elsewhere classified: Secondary | ICD-10-CM | POA: Diagnosis not present

## 2018-08-13 DIAGNOSIS — J181 Lobar pneumonia, unspecified organism: Secondary | ICD-10-CM | POA: Diagnosis not present

## 2018-08-13 DIAGNOSIS — G301 Alzheimer's disease with late onset: Secondary | ICD-10-CM | POA: Diagnosis not present

## 2018-08-25 DIAGNOSIS — R451 Restlessness and agitation: Secondary | ICD-10-CM | POA: Diagnosis not present

## 2018-08-25 DIAGNOSIS — Z79899 Other long term (current) drug therapy: Secondary | ICD-10-CM | POA: Diagnosis not present

## 2018-08-25 DIAGNOSIS — I10 Essential (primary) hypertension: Secondary | ICD-10-CM | POA: Diagnosis not present

## 2018-08-25 DIAGNOSIS — M150 Primary generalized (osteo)arthritis: Secondary | ICD-10-CM | POA: Diagnosis not present

## 2018-08-25 DIAGNOSIS — E782 Mixed hyperlipidemia: Secondary | ICD-10-CM | POA: Diagnosis not present

## 2018-08-25 DIAGNOSIS — F0151 Vascular dementia with behavioral disturbance: Secondary | ICD-10-CM | POA: Diagnosis not present

## 2018-08-26 DIAGNOSIS — R3981 Functional urinary incontinence: Secondary | ICD-10-CM | POA: Diagnosis not present

## 2018-08-26 DIAGNOSIS — Z79899 Other long term (current) drug therapy: Secondary | ICD-10-CM | POA: Diagnosis not present

## 2018-08-26 DIAGNOSIS — N390 Urinary tract infection, site not specified: Secondary | ICD-10-CM | POA: Diagnosis not present

## 2018-09-01 DIAGNOSIS — F0151 Vascular dementia with behavioral disturbance: Secondary | ICD-10-CM | POA: Diagnosis not present

## 2018-09-01 DIAGNOSIS — Z79899 Other long term (current) drug therapy: Secondary | ICD-10-CM | POA: Diagnosis not present

## 2018-09-01 DIAGNOSIS — R451 Restlessness and agitation: Secondary | ICD-10-CM | POA: Diagnosis not present

## 2018-09-11 DIAGNOSIS — R4182 Altered mental status, unspecified: Secondary | ICD-10-CM | POA: Diagnosis not present

## 2018-09-11 DIAGNOSIS — F0151 Vascular dementia with behavioral disturbance: Secondary | ICD-10-CM | POA: Diagnosis not present

## 2018-09-11 DIAGNOSIS — R451 Restlessness and agitation: Secondary | ICD-10-CM | POA: Diagnosis not present

## 2018-09-14 DIAGNOSIS — R0781 Pleurodynia: Secondary | ICD-10-CM | POA: Diagnosis not present

## 2018-09-15 DIAGNOSIS — Z79899 Other long term (current) drug therapy: Secondary | ICD-10-CM | POA: Diagnosis not present

## 2018-09-15 DIAGNOSIS — R2681 Unsteadiness on feet: Secondary | ICD-10-CM | POA: Diagnosis not present

## 2018-09-15 DIAGNOSIS — F0151 Vascular dementia with behavioral disturbance: Secondary | ICD-10-CM | POA: Diagnosis not present

## 2018-09-15 DIAGNOSIS — R0781 Pleurodynia: Secondary | ICD-10-CM | POA: Diagnosis not present

## 2018-09-29 DIAGNOSIS — F0151 Vascular dementia with behavioral disturbance: Secondary | ICD-10-CM | POA: Diagnosis not present

## 2018-09-29 DIAGNOSIS — M8949 Other hypertrophic osteoarthropathy, multiple sites: Secondary | ICD-10-CM | POA: Diagnosis not present

## 2018-09-29 DIAGNOSIS — Z79899 Other long term (current) drug therapy: Secondary | ICD-10-CM | POA: Diagnosis not present

## 2018-09-30 DIAGNOSIS — Z79899 Other long term (current) drug therapy: Secondary | ICD-10-CM | POA: Diagnosis not present

## 2018-10-13 DIAGNOSIS — F0151 Vascular dementia with behavioral disturbance: Secondary | ICD-10-CM | POA: Diagnosis not present

## 2018-10-13 DIAGNOSIS — Z79899 Other long term (current) drug therapy: Secondary | ICD-10-CM | POA: Diagnosis not present

## 2018-10-13 DIAGNOSIS — J189 Pneumonia, unspecified organism: Secondary | ICD-10-CM | POA: Diagnosis not present

## 2018-10-14 DIAGNOSIS — R0989 Other specified symptoms and signs involving the circulatory and respiratory systems: Secondary | ICD-10-CM | POA: Diagnosis not present

## 2018-10-14 DIAGNOSIS — R4182 Altered mental status, unspecified: Secondary | ICD-10-CM | POA: Diagnosis not present

## 2018-10-20 DIAGNOSIS — Z79899 Other long term (current) drug therapy: Secondary | ICD-10-CM | POA: Diagnosis not present

## 2018-10-20 DIAGNOSIS — J189 Pneumonia, unspecified organism: Secondary | ICD-10-CM | POA: Diagnosis not present

## 2018-10-20 DIAGNOSIS — F0151 Vascular dementia with behavioral disturbance: Secondary | ICD-10-CM | POA: Diagnosis not present

## 2018-11-03 DIAGNOSIS — R2681 Unsteadiness on feet: Secondary | ICD-10-CM | POA: Diagnosis not present

## 2018-11-03 DIAGNOSIS — R634 Abnormal weight loss: Secondary | ICD-10-CM | POA: Diagnosis not present

## 2018-11-03 DIAGNOSIS — Z79899 Other long term (current) drug therapy: Secondary | ICD-10-CM | POA: Diagnosis not present

## 2018-11-03 DIAGNOSIS — F0151 Vascular dementia with behavioral disturbance: Secondary | ICD-10-CM | POA: Diagnosis not present

## 2018-11-05 DIAGNOSIS — F419 Anxiety disorder, unspecified: Secondary | ICD-10-CM | POA: Diagnosis not present

## 2018-11-09 DIAGNOSIS — I2510 Atherosclerotic heart disease of native coronary artery without angina pectoris: Secondary | ICD-10-CM | POA: Diagnosis not present

## 2018-11-09 DIAGNOSIS — M150 Primary generalized (osteo)arthritis: Secondary | ICD-10-CM | POA: Diagnosis not present

## 2018-11-09 DIAGNOSIS — E785 Hyperlipidemia, unspecified: Secondary | ICD-10-CM | POA: Diagnosis not present

## 2018-11-09 DIAGNOSIS — F0151 Vascular dementia with behavioral disturbance: Secondary | ICD-10-CM | POA: Diagnosis not present

## 2018-11-09 DIAGNOSIS — I10 Essential (primary) hypertension: Secondary | ICD-10-CM | POA: Diagnosis not present

## 2018-11-09 DIAGNOSIS — F419 Anxiety disorder, unspecified: Secondary | ICD-10-CM | POA: Diagnosis not present

## 2018-11-09 DIAGNOSIS — I679 Cerebrovascular disease, unspecified: Secondary | ICD-10-CM | POA: Diagnosis not present

## 2018-11-11 DIAGNOSIS — E785 Hyperlipidemia, unspecified: Secondary | ICD-10-CM | POA: Diagnosis not present

## 2018-11-11 DIAGNOSIS — F0151 Vascular dementia with behavioral disturbance: Secondary | ICD-10-CM | POA: Diagnosis not present

## 2018-11-11 DIAGNOSIS — M150 Primary generalized (osteo)arthritis: Secondary | ICD-10-CM | POA: Diagnosis not present

## 2018-11-11 DIAGNOSIS — I10 Essential (primary) hypertension: Secondary | ICD-10-CM | POA: Diagnosis not present

## 2018-11-11 DIAGNOSIS — I679 Cerebrovascular disease, unspecified: Secondary | ICD-10-CM | POA: Diagnosis not present

## 2018-11-11 DIAGNOSIS — I2510 Atherosclerotic heart disease of native coronary artery without angina pectoris: Secondary | ICD-10-CM | POA: Diagnosis not present

## 2018-11-16 DIAGNOSIS — I2510 Atherosclerotic heart disease of native coronary artery without angina pectoris: Secondary | ICD-10-CM | POA: Diagnosis not present

## 2018-11-16 DIAGNOSIS — F0151 Vascular dementia with behavioral disturbance: Secondary | ICD-10-CM | POA: Diagnosis not present

## 2018-11-16 DIAGNOSIS — I679 Cerebrovascular disease, unspecified: Secondary | ICD-10-CM | POA: Diagnosis not present

## 2018-11-16 DIAGNOSIS — E785 Hyperlipidemia, unspecified: Secondary | ICD-10-CM | POA: Diagnosis not present

## 2018-11-16 DIAGNOSIS — M150 Primary generalized (osteo)arthritis: Secondary | ICD-10-CM | POA: Diagnosis not present

## 2018-11-16 DIAGNOSIS — I10 Essential (primary) hypertension: Secondary | ICD-10-CM | POA: Diagnosis not present

## 2018-11-17 DIAGNOSIS — J189 Pneumonia, unspecified organism: Secondary | ICD-10-CM | POA: Diagnosis not present

## 2018-11-17 DIAGNOSIS — R918 Other nonspecific abnormal finding of lung field: Secondary | ICD-10-CM | POA: Diagnosis not present

## 2018-11-17 DIAGNOSIS — Z79899 Other long term (current) drug therapy: Secondary | ICD-10-CM | POA: Diagnosis not present

## 2018-11-17 DIAGNOSIS — F0151 Vascular dementia with behavioral disturbance: Secondary | ICD-10-CM | POA: Diagnosis not present

## 2018-11-17 DIAGNOSIS — R4182 Altered mental status, unspecified: Secondary | ICD-10-CM | POA: Diagnosis not present

## 2018-11-17 DIAGNOSIS — R2681 Unsteadiness on feet: Secondary | ICD-10-CM | POA: Diagnosis not present

## 2018-11-18 DIAGNOSIS — I2510 Atherosclerotic heart disease of native coronary artery without angina pectoris: Secondary | ICD-10-CM | POA: Diagnosis not present

## 2018-11-18 DIAGNOSIS — M150 Primary generalized (osteo)arthritis: Secondary | ICD-10-CM | POA: Diagnosis not present

## 2018-11-18 DIAGNOSIS — E785 Hyperlipidemia, unspecified: Secondary | ICD-10-CM | POA: Diagnosis not present

## 2018-11-18 DIAGNOSIS — I679 Cerebrovascular disease, unspecified: Secondary | ICD-10-CM | POA: Diagnosis not present

## 2018-11-18 DIAGNOSIS — F0151 Vascular dementia with behavioral disturbance: Secondary | ICD-10-CM | POA: Diagnosis not present

## 2018-11-18 DIAGNOSIS — I10 Essential (primary) hypertension: Secondary | ICD-10-CM | POA: Diagnosis not present

## 2018-11-22 DIAGNOSIS — F419 Anxiety disorder, unspecified: Secondary | ICD-10-CM | POA: Diagnosis not present

## 2018-11-22 DIAGNOSIS — F0390 Unspecified dementia without behavioral disturbance: Secondary | ICD-10-CM | POA: Diagnosis not present

## 2018-11-22 DIAGNOSIS — F0391 Unspecified dementia with behavioral disturbance: Secondary | ICD-10-CM | POA: Diagnosis not present

## 2018-11-22 DIAGNOSIS — R0989 Other specified symptoms and signs involving the circulatory and respiratory systems: Secondary | ICD-10-CM | POA: Diagnosis not present

## 2018-11-24 DIAGNOSIS — R4689 Other symptoms and signs involving appearance and behavior: Secondary | ICD-10-CM | POA: Diagnosis not present

## 2018-11-24 DIAGNOSIS — F0151 Vascular dementia with behavioral disturbance: Secondary | ICD-10-CM | POA: Diagnosis not present

## 2018-11-24 DIAGNOSIS — Z79899 Other long term (current) drug therapy: Secondary | ICD-10-CM | POA: Diagnosis not present

## 2018-11-25 DIAGNOSIS — I2510 Atherosclerotic heart disease of native coronary artery without angina pectoris: Secondary | ICD-10-CM | POA: Diagnosis not present

## 2018-11-25 DIAGNOSIS — E785 Hyperlipidemia, unspecified: Secondary | ICD-10-CM | POA: Diagnosis not present

## 2018-11-25 DIAGNOSIS — I679 Cerebrovascular disease, unspecified: Secondary | ICD-10-CM | POA: Diagnosis not present

## 2018-11-25 DIAGNOSIS — M150 Primary generalized (osteo)arthritis: Secondary | ICD-10-CM | POA: Diagnosis not present

## 2018-11-25 DIAGNOSIS — F0151 Vascular dementia with behavioral disturbance: Secondary | ICD-10-CM | POA: Diagnosis not present

## 2018-11-25 DIAGNOSIS — I10 Essential (primary) hypertension: Secondary | ICD-10-CM | POA: Diagnosis not present

## 2018-11-25 DIAGNOSIS — Z79899 Other long term (current) drug therapy: Secondary | ICD-10-CM | POA: Diagnosis not present

## 2018-11-27 DIAGNOSIS — E785 Hyperlipidemia, unspecified: Secondary | ICD-10-CM | POA: Diagnosis not present

## 2018-11-27 DIAGNOSIS — I679 Cerebrovascular disease, unspecified: Secondary | ICD-10-CM | POA: Diagnosis not present

## 2018-11-27 DIAGNOSIS — I10 Essential (primary) hypertension: Secondary | ICD-10-CM | POA: Diagnosis not present

## 2018-11-27 DIAGNOSIS — I2510 Atherosclerotic heart disease of native coronary artery without angina pectoris: Secondary | ICD-10-CM | POA: Diagnosis not present

## 2018-11-27 DIAGNOSIS — M150 Primary generalized (osteo)arthritis: Secondary | ICD-10-CM | POA: Diagnosis not present

## 2018-11-27 DIAGNOSIS — F0151 Vascular dementia with behavioral disturbance: Secondary | ICD-10-CM | POA: Diagnosis not present

## 2018-11-30 DIAGNOSIS — I2510 Atherosclerotic heart disease of native coronary artery without angina pectoris: Secondary | ICD-10-CM | POA: Diagnosis not present

## 2018-11-30 DIAGNOSIS — M150 Primary generalized (osteo)arthritis: Secondary | ICD-10-CM | POA: Diagnosis not present

## 2018-11-30 DIAGNOSIS — I10 Essential (primary) hypertension: Secondary | ICD-10-CM | POA: Diagnosis not present

## 2018-11-30 DIAGNOSIS — I679 Cerebrovascular disease, unspecified: Secondary | ICD-10-CM | POA: Diagnosis not present

## 2018-11-30 DIAGNOSIS — F0151 Vascular dementia with behavioral disturbance: Secondary | ICD-10-CM | POA: Diagnosis not present

## 2018-11-30 DIAGNOSIS — E785 Hyperlipidemia, unspecified: Secondary | ICD-10-CM | POA: Diagnosis not present

## 2018-12-01 DIAGNOSIS — F0151 Vascular dementia with behavioral disturbance: Secondary | ICD-10-CM | POA: Diagnosis not present

## 2018-12-01 DIAGNOSIS — Z79899 Other long term (current) drug therapy: Secondary | ICD-10-CM | POA: Diagnosis not present

## 2018-12-01 DIAGNOSIS — M8949 Other hypertrophic osteoarthropathy, multiple sites: Secondary | ICD-10-CM | POA: Diagnosis not present

## 2018-12-02 DIAGNOSIS — I10 Essential (primary) hypertension: Secondary | ICD-10-CM | POA: Diagnosis not present

## 2018-12-02 DIAGNOSIS — F0151 Vascular dementia with behavioral disturbance: Secondary | ICD-10-CM | POA: Diagnosis not present

## 2018-12-02 DIAGNOSIS — I2510 Atherosclerotic heart disease of native coronary artery without angina pectoris: Secondary | ICD-10-CM | POA: Diagnosis not present

## 2018-12-02 DIAGNOSIS — M150 Primary generalized (osteo)arthritis: Secondary | ICD-10-CM | POA: Diagnosis not present

## 2018-12-02 DIAGNOSIS — E785 Hyperlipidemia, unspecified: Secondary | ICD-10-CM | POA: Diagnosis not present

## 2018-12-02 DIAGNOSIS — I679 Cerebrovascular disease, unspecified: Secondary | ICD-10-CM | POA: Diagnosis not present

## 2018-12-07 DIAGNOSIS — E785 Hyperlipidemia, unspecified: Secondary | ICD-10-CM | POA: Diagnosis not present

## 2018-12-07 DIAGNOSIS — I679 Cerebrovascular disease, unspecified: Secondary | ICD-10-CM | POA: Diagnosis not present

## 2018-12-07 DIAGNOSIS — I2510 Atherosclerotic heart disease of native coronary artery without angina pectoris: Secondary | ICD-10-CM | POA: Diagnosis not present

## 2018-12-07 DIAGNOSIS — F0151 Vascular dementia with behavioral disturbance: Secondary | ICD-10-CM | POA: Diagnosis not present

## 2018-12-07 DIAGNOSIS — M150 Primary generalized (osteo)arthritis: Secondary | ICD-10-CM | POA: Diagnosis not present

## 2018-12-07 DIAGNOSIS — I10 Essential (primary) hypertension: Secondary | ICD-10-CM | POA: Diagnosis not present

## 2018-12-09 DIAGNOSIS — I679 Cerebrovascular disease, unspecified: Secondary | ICD-10-CM | POA: Diagnosis not present

## 2018-12-09 DIAGNOSIS — E785 Hyperlipidemia, unspecified: Secondary | ICD-10-CM | POA: Diagnosis not present

## 2018-12-09 DIAGNOSIS — M150 Primary generalized (osteo)arthritis: Secondary | ICD-10-CM | POA: Diagnosis not present

## 2018-12-09 DIAGNOSIS — F0151 Vascular dementia with behavioral disturbance: Secondary | ICD-10-CM | POA: Diagnosis not present

## 2018-12-09 DIAGNOSIS — I10 Essential (primary) hypertension: Secondary | ICD-10-CM | POA: Diagnosis not present

## 2018-12-09 DIAGNOSIS — F419 Anxiety disorder, unspecified: Secondary | ICD-10-CM | POA: Diagnosis not present

## 2018-12-09 DIAGNOSIS — I2510 Atherosclerotic heart disease of native coronary artery without angina pectoris: Secondary | ICD-10-CM | POA: Diagnosis not present

## 2018-12-10 DIAGNOSIS — I10 Essential (primary) hypertension: Secondary | ICD-10-CM | POA: Diagnosis not present

## 2018-12-10 DIAGNOSIS — I679 Cerebrovascular disease, unspecified: Secondary | ICD-10-CM | POA: Diagnosis not present

## 2018-12-10 DIAGNOSIS — M150 Primary generalized (osteo)arthritis: Secondary | ICD-10-CM | POA: Diagnosis not present

## 2018-12-10 DIAGNOSIS — E785 Hyperlipidemia, unspecified: Secondary | ICD-10-CM | POA: Diagnosis not present

## 2018-12-10 DIAGNOSIS — F0151 Vascular dementia with behavioral disturbance: Secondary | ICD-10-CM | POA: Diagnosis not present

## 2018-12-10 DIAGNOSIS — I2510 Atherosclerotic heart disease of native coronary artery without angina pectoris: Secondary | ICD-10-CM | POA: Diagnosis not present

## 2018-12-13 DIAGNOSIS — F419 Anxiety disorder, unspecified: Secondary | ICD-10-CM | POA: Diagnosis not present

## 2018-12-14 DIAGNOSIS — M150 Primary generalized (osteo)arthritis: Secondary | ICD-10-CM | POA: Diagnosis not present

## 2018-12-14 DIAGNOSIS — I10 Essential (primary) hypertension: Secondary | ICD-10-CM | POA: Diagnosis not present

## 2018-12-14 DIAGNOSIS — Z1159 Encounter for screening for other viral diseases: Secondary | ICD-10-CM | POA: Diagnosis not present

## 2018-12-14 DIAGNOSIS — E785 Hyperlipidemia, unspecified: Secondary | ICD-10-CM | POA: Diagnosis not present

## 2018-12-14 DIAGNOSIS — F0151 Vascular dementia with behavioral disturbance: Secondary | ICD-10-CM | POA: Diagnosis not present

## 2018-12-14 DIAGNOSIS — I679 Cerebrovascular disease, unspecified: Secondary | ICD-10-CM | POA: Diagnosis not present

## 2018-12-14 DIAGNOSIS — I2510 Atherosclerotic heart disease of native coronary artery without angina pectoris: Secondary | ICD-10-CM | POA: Diagnosis not present

## 2018-12-15 DIAGNOSIS — R634 Abnormal weight loss: Secondary | ICD-10-CM | POA: Diagnosis not present

## 2018-12-15 DIAGNOSIS — Z79899 Other long term (current) drug therapy: Secondary | ICD-10-CM | POA: Diagnosis not present

## 2018-12-15 DIAGNOSIS — F0151 Vascular dementia with behavioral disturbance: Secondary | ICD-10-CM | POA: Diagnosis not present

## 2018-12-15 DIAGNOSIS — M8949 Other hypertrophic osteoarthropathy, multiple sites: Secondary | ICD-10-CM | POA: Diagnosis not present

## 2018-12-16 DIAGNOSIS — I2510 Atherosclerotic heart disease of native coronary artery without angina pectoris: Secondary | ICD-10-CM | POA: Diagnosis not present

## 2018-12-16 DIAGNOSIS — E785 Hyperlipidemia, unspecified: Secondary | ICD-10-CM | POA: Diagnosis not present

## 2018-12-16 DIAGNOSIS — I10 Essential (primary) hypertension: Secondary | ICD-10-CM | POA: Diagnosis not present

## 2018-12-16 DIAGNOSIS — I679 Cerebrovascular disease, unspecified: Secondary | ICD-10-CM | POA: Diagnosis not present

## 2018-12-16 DIAGNOSIS — F0151 Vascular dementia with behavioral disturbance: Secondary | ICD-10-CM | POA: Diagnosis not present

## 2018-12-16 DIAGNOSIS — M150 Primary generalized (osteo)arthritis: Secondary | ICD-10-CM | POA: Diagnosis not present

## 2018-12-21 DIAGNOSIS — E785 Hyperlipidemia, unspecified: Secondary | ICD-10-CM | POA: Diagnosis not present

## 2018-12-21 DIAGNOSIS — I10 Essential (primary) hypertension: Secondary | ICD-10-CM | POA: Diagnosis not present

## 2018-12-21 DIAGNOSIS — I679 Cerebrovascular disease, unspecified: Secondary | ICD-10-CM | POA: Diagnosis not present

## 2018-12-21 DIAGNOSIS — M150 Primary generalized (osteo)arthritis: Secondary | ICD-10-CM | POA: Diagnosis not present

## 2018-12-21 DIAGNOSIS — I2510 Atherosclerotic heart disease of native coronary artery without angina pectoris: Secondary | ICD-10-CM | POA: Diagnosis not present

## 2018-12-21 DIAGNOSIS — F0151 Vascular dementia with behavioral disturbance: Secondary | ICD-10-CM | POA: Diagnosis not present

## 2018-12-23 DIAGNOSIS — I2510 Atherosclerotic heart disease of native coronary artery without angina pectoris: Secondary | ICD-10-CM | POA: Diagnosis not present

## 2018-12-23 DIAGNOSIS — F0151 Vascular dementia with behavioral disturbance: Secondary | ICD-10-CM | POA: Diagnosis not present

## 2018-12-23 DIAGNOSIS — M150 Primary generalized (osteo)arthritis: Secondary | ICD-10-CM | POA: Diagnosis not present

## 2018-12-23 DIAGNOSIS — I10 Essential (primary) hypertension: Secondary | ICD-10-CM | POA: Diagnosis not present

## 2018-12-23 DIAGNOSIS — I679 Cerebrovascular disease, unspecified: Secondary | ICD-10-CM | POA: Diagnosis not present

## 2018-12-23 DIAGNOSIS — E785 Hyperlipidemia, unspecified: Secondary | ICD-10-CM | POA: Diagnosis not present

## 2018-12-26 DIAGNOSIS — M6281 Muscle weakness (generalized): Secondary | ICD-10-CM | POA: Diagnosis not present

## 2018-12-26 DIAGNOSIS — G9341 Metabolic encephalopathy: Secondary | ICD-10-CM | POA: Diagnosis not present

## 2018-12-26 DIAGNOSIS — G301 Alzheimer's disease with late onset: Secondary | ICD-10-CM | POA: Diagnosis present

## 2018-12-26 DIAGNOSIS — R401 Stupor: Secondary | ICD-10-CM | POA: Diagnosis not present

## 2018-12-26 DIAGNOSIS — I248 Other forms of acute ischemic heart disease: Secondary | ICD-10-CM | POA: Diagnosis not present

## 2018-12-26 DIAGNOSIS — I083 Combined rheumatic disorders of mitral, aortic and tricuspid valves: Secondary | ICD-10-CM | POA: Diagnosis not present

## 2018-12-26 DIAGNOSIS — N1330 Unspecified hydronephrosis: Secondary | ICD-10-CM | POA: Diagnosis not present

## 2018-12-26 DIAGNOSIS — R7989 Other specified abnormal findings of blood chemistry: Secondary | ICD-10-CM | POA: Diagnosis not present

## 2018-12-26 DIAGNOSIS — F339 Major depressive disorder, recurrent, unspecified: Secondary | ICD-10-CM | POA: Diagnosis not present

## 2018-12-26 DIAGNOSIS — R4781 Slurred speech: Secondary | ICD-10-CM | POA: Diagnosis not present

## 2018-12-26 DIAGNOSIS — Z1159 Encounter for screening for other viral diseases: Secondary | ICD-10-CM | POA: Diagnosis not present

## 2018-12-26 DIAGNOSIS — E875 Hyperkalemia: Secondary | ICD-10-CM | POA: Diagnosis not present

## 2018-12-26 DIAGNOSIS — Z7401 Bed confinement status: Secondary | ICD-10-CM | POA: Diagnosis not present

## 2018-12-26 DIAGNOSIS — I519 Heart disease, unspecified: Secondary | ICD-10-CM | POA: Diagnosis not present

## 2018-12-26 DIAGNOSIS — R748 Abnormal levels of other serum enzymes: Secondary | ICD-10-CM | POA: Diagnosis not present

## 2018-12-26 DIAGNOSIS — I472 Ventricular tachycardia: Secondary | ICD-10-CM | POA: Diagnosis not present

## 2018-12-26 DIAGNOSIS — Z66 Do not resuscitate: Secondary | ICD-10-CM | POA: Diagnosis not present

## 2018-12-26 DIAGNOSIS — F0280 Dementia in other diseases classified elsewhere without behavioral disturbance: Secondary | ICD-10-CM | POA: Diagnosis present

## 2018-12-26 DIAGNOSIS — N179 Acute kidney failure, unspecified: Secondary | ICD-10-CM | POA: Diagnosis not present

## 2018-12-26 DIAGNOSIS — R4182 Altered mental status, unspecified: Secondary | ICD-10-CM | POA: Diagnosis not present

## 2018-12-26 DIAGNOSIS — I119 Hypertensive heart disease without heart failure: Secondary | ICD-10-CM | POA: Diagnosis not present

## 2018-12-26 DIAGNOSIS — Z20828 Contact with and (suspected) exposure to other viral communicable diseases: Secondary | ICD-10-CM | POA: Diagnosis not present

## 2018-12-26 DIAGNOSIS — Z79899 Other long term (current) drug therapy: Secondary | ICD-10-CM | POA: Diagnosis not present

## 2018-12-26 DIAGNOSIS — N319 Neuromuscular dysfunction of bladder, unspecified: Secondary | ICD-10-CM | POA: Diagnosis not present

## 2018-12-26 DIAGNOSIS — I469 Cardiac arrest, cause unspecified: Secondary | ICD-10-CM | POA: Diagnosis not present

## 2018-12-26 DIAGNOSIS — F419 Anxiety disorder, unspecified: Secondary | ICD-10-CM | POA: Diagnosis not present

## 2018-12-26 DIAGNOSIS — D291 Benign neoplasm of prostate: Secondary | ICD-10-CM | POA: Diagnosis not present

## 2018-12-26 DIAGNOSIS — R001 Bradycardia, unspecified: Secondary | ICD-10-CM | POA: Diagnosis not present

## 2018-12-26 DIAGNOSIS — R404 Transient alteration of awareness: Secondary | ICD-10-CM | POA: Diagnosis not present

## 2018-12-26 DIAGNOSIS — Z87891 Personal history of nicotine dependence: Secondary | ICD-10-CM | POA: Diagnosis not present

## 2018-12-26 DIAGNOSIS — I4581 Long QT syndrome: Secondary | ICD-10-CM | POA: Diagnosis not present

## 2018-12-26 DIAGNOSIS — Z6824 Body mass index (BMI) 24.0-24.9, adult: Secondary | ICD-10-CM | POA: Diagnosis not present

## 2018-12-26 DIAGNOSIS — E870 Hyperosmolality and hypernatremia: Secondary | ICD-10-CM | POA: Diagnosis present

## 2018-12-26 DIAGNOSIS — R627 Adult failure to thrive: Secondary | ICD-10-CM | POA: Diagnosis present

## 2018-12-26 DIAGNOSIS — I10 Essential (primary) hypertension: Secondary | ICD-10-CM | POA: Diagnosis present

## 2018-12-26 DIAGNOSIS — I493 Ventricular premature depolarization: Secondary | ICD-10-CM | POA: Diagnosis not present

## 2018-12-26 DIAGNOSIS — R29810 Facial weakness: Secondary | ICD-10-CM | POA: Diagnosis not present

## 2018-12-26 DIAGNOSIS — E785 Hyperlipidemia, unspecified: Secondary | ICD-10-CM | POA: Diagnosis not present

## 2018-12-26 DIAGNOSIS — Z7982 Long term (current) use of aspirin: Secondary | ICD-10-CM | POA: Diagnosis not present

## 2018-12-26 DIAGNOSIS — R339 Retention of urine, unspecified: Secondary | ICD-10-CM | POA: Diagnosis present

## 2018-12-26 DIAGNOSIS — G9340 Encephalopathy, unspecified: Secondary | ICD-10-CM | POA: Diagnosis not present

## 2018-12-26 DIAGNOSIS — E871 Hypo-osmolality and hyponatremia: Secondary | ICD-10-CM | POA: Diagnosis not present

## 2018-12-26 DIAGNOSIS — R262 Difficulty in walking, not elsewhere classified: Secondary | ICD-10-CM | POA: Diagnosis not present

## 2018-12-26 DIAGNOSIS — Z8673 Personal history of transient ischemic attack (TIA), and cerebral infarction without residual deficits: Secondary | ICD-10-CM | POA: Diagnosis not present

## 2018-12-26 DIAGNOSIS — M2550 Pain in unspecified joint: Secondary | ICD-10-CM | POA: Diagnosis not present

## 2019-01-01 DIAGNOSIS — R0981 Nasal congestion: Secondary | ICD-10-CM | POA: Diagnosis not present

## 2019-01-01 DIAGNOSIS — I10 Essential (primary) hypertension: Secondary | ICD-10-CM | POA: Diagnosis not present

## 2019-01-01 DIAGNOSIS — I519 Heart disease, unspecified: Secondary | ICD-10-CM | POA: Diagnosis not present

## 2019-01-01 DIAGNOSIS — Z5181 Encounter for therapeutic drug level monitoring: Secondary | ICD-10-CM | POA: Diagnosis not present

## 2019-01-01 DIAGNOSIS — F0280 Dementia in other diseases classified elsewhere without behavioral disturbance: Secondary | ICD-10-CM | POA: Diagnosis not present

## 2019-01-01 DIAGNOSIS — R627 Adult failure to thrive: Secondary | ICD-10-CM | POA: Diagnosis not present

## 2019-01-01 DIAGNOSIS — Z7401 Bed confinement status: Secondary | ICD-10-CM | POA: Diagnosis not present

## 2019-01-01 DIAGNOSIS — M2550 Pain in unspecified joint: Secondary | ICD-10-CM | POA: Diagnosis not present

## 2019-01-01 DIAGNOSIS — I959 Hypotension, unspecified: Secondary | ICD-10-CM | POA: Diagnosis not present

## 2019-01-01 DIAGNOSIS — I499 Cardiac arrhythmia, unspecified: Secondary | ICD-10-CM | POA: Diagnosis not present

## 2019-01-01 DIAGNOSIS — R0902 Hypoxemia: Secondary | ICD-10-CM | POA: Diagnosis not present

## 2019-01-01 DIAGNOSIS — G301 Alzheimer's disease with late onset: Secondary | ICD-10-CM | POA: Diagnosis not present

## 2019-01-01 DIAGNOSIS — N319 Neuromuscular dysfunction of bladder, unspecified: Secondary | ICD-10-CM | POA: Diagnosis not present

## 2019-01-01 DIAGNOSIS — R262 Difficulty in walking, not elsewhere classified: Secondary | ICD-10-CM | POA: Diagnosis not present

## 2019-01-01 DIAGNOSIS — F0390 Unspecified dementia without behavioral disturbance: Secondary | ICD-10-CM | POA: Diagnosis not present

## 2019-01-01 DIAGNOSIS — R0602 Shortness of breath: Secondary | ICD-10-CM | POA: Diagnosis not present

## 2019-01-01 DIAGNOSIS — M6281 Muscle weakness (generalized): Secondary | ICD-10-CM | POA: Diagnosis not present

## 2019-01-01 DIAGNOSIS — Z9189 Other specified personal risk factors, not elsewhere classified: Secondary | ICD-10-CM | POA: Diagnosis not present

## 2019-01-01 DIAGNOSIS — R404 Transient alteration of awareness: Secondary | ICD-10-CM | POA: Diagnosis not present

## 2019-01-01 DIAGNOSIS — I248 Other forms of acute ischemic heart disease: Secondary | ICD-10-CM | POA: Diagnosis not present

## 2019-01-01 DIAGNOSIS — I2510 Atherosclerotic heart disease of native coronary artery without angina pectoris: Secondary | ICD-10-CM | POA: Diagnosis not present

## 2019-01-01 DIAGNOSIS — D291 Benign neoplasm of prostate: Secondary | ICD-10-CM | POA: Diagnosis not present

## 2019-01-01 DIAGNOSIS — R509 Fever, unspecified: Secondary | ICD-10-CM | POA: Diagnosis not present

## 2019-01-01 DIAGNOSIS — F339 Major depressive disorder, recurrent, unspecified: Secondary | ICD-10-CM | POA: Diagnosis not present

## 2019-01-01 DIAGNOSIS — R0989 Other specified symptoms and signs involving the circulatory and respiratory systems: Secondary | ICD-10-CM | POA: Diagnosis not present

## 2019-01-01 DIAGNOSIS — R05 Cough: Secondary | ICD-10-CM | POA: Diagnosis not present

## 2019-01-01 DIAGNOSIS — R4020 Unspecified coma: Secondary | ICD-10-CM | POA: Diagnosis not present

## 2019-01-01 DIAGNOSIS — I469 Cardiac arrest, cause unspecified: Secondary | ICD-10-CM | POA: Diagnosis not present

## 2019-01-01 DIAGNOSIS — G4710 Hypersomnia, unspecified: Secondary | ICD-10-CM | POA: Diagnosis not present

## 2019-01-01 DIAGNOSIS — E785 Hyperlipidemia, unspecified: Secondary | ICD-10-CM | POA: Diagnosis not present

## 2019-01-01 DIAGNOSIS — R339 Retention of urine, unspecified: Secondary | ICD-10-CM | POA: Diagnosis not present

## 2019-01-01 DIAGNOSIS — F419 Anxiety disorder, unspecified: Secondary | ICD-10-CM | POA: Diagnosis not present

## 2019-01-04 DIAGNOSIS — Z9189 Other specified personal risk factors, not elsewhere classified: Secondary | ICD-10-CM | POA: Diagnosis not present

## 2019-01-04 DIAGNOSIS — R339 Retention of urine, unspecified: Secondary | ICD-10-CM | POA: Diagnosis not present

## 2019-01-04 DIAGNOSIS — M6281 Muscle weakness (generalized): Secondary | ICD-10-CM | POA: Diagnosis not present

## 2019-01-04 DIAGNOSIS — R627 Adult failure to thrive: Secondary | ICD-10-CM | POA: Diagnosis not present

## 2019-01-04 DIAGNOSIS — I2510 Atherosclerotic heart disease of native coronary artery without angina pectoris: Secondary | ICD-10-CM | POA: Diagnosis not present

## 2019-01-08 DIAGNOSIS — R0989 Other specified symptoms and signs involving the circulatory and respiratory systems: Secondary | ICD-10-CM | POA: Diagnosis not present

## 2019-01-08 DIAGNOSIS — R0981 Nasal congestion: Secondary | ICD-10-CM | POA: Diagnosis not present

## 2019-01-13 DIAGNOSIS — G301 Alzheimer's disease with late onset: Secondary | ICD-10-CM | POA: Diagnosis not present

## 2019-01-13 DIAGNOSIS — I519 Heart disease, unspecified: Secondary | ICD-10-CM | POA: Diagnosis not present

## 2019-01-13 DIAGNOSIS — I10 Essential (primary) hypertension: Secondary | ICD-10-CM | POA: Diagnosis not present

## 2019-01-13 DIAGNOSIS — F419 Anxiety disorder, unspecified: Secondary | ICD-10-CM | POA: Diagnosis not present

## 2019-01-15 DIAGNOSIS — F0390 Unspecified dementia without behavioral disturbance: Secondary | ICD-10-CM | POA: Diagnosis not present

## 2019-01-15 DIAGNOSIS — G4710 Hypersomnia, unspecified: Secondary | ICD-10-CM | POA: Diagnosis not present

## 2019-01-15 DIAGNOSIS — Z5181 Encounter for therapeutic drug level monitoring: Secondary | ICD-10-CM | POA: Diagnosis not present

## 2019-01-17 DIAGNOSIS — I469 Cardiac arrest, cause unspecified: Secondary | ICD-10-CM | POA: Diagnosis not present

## 2019-01-17 DIAGNOSIS — R509 Fever, unspecified: Secondary | ICD-10-CM | POA: Diagnosis not present

## 2019-01-30 DEATH — deceased
